# Patient Record
Sex: Female | Born: 1995 | Hispanic: No | Marital: Married | Smoking: Never smoker
Health system: Southern US, Community
[De-identification: ages and names within clinical notes are randomized; demographics above are authoritative.]

## PROBLEM LIST (undated history)

## (undated) ENCOUNTER — Inpatient Hospital Stay (HOSPITAL_COMMUNITY): Payer: Self-pay

## (undated) DIAGNOSIS — E119 Type 2 diabetes mellitus without complications: Secondary | ICD-10-CM

## (undated) DIAGNOSIS — Z789 Other specified health status: Secondary | ICD-10-CM

## (undated) HISTORY — DX: Other specified health status: Z78.9

## (undated) HISTORY — PX: NO PAST SURGERIES: SHX2092

## (undated) HISTORY — DX: Type 2 diabetes mellitus without complications: E11.9

---

## 2017-04-17 LAB — OB RESULTS CONSOLE RPR: RPR: NONREACTIVE

## 2017-04-17 LAB — OB RESULTS CONSOLE HIV ANTIBODY (ROUTINE TESTING): HIV: NONREACTIVE

## 2017-04-17 LAB — OB RESULTS CONSOLE RUBELLA ANTIBODY, IGM: Rubella: IMMUNE

## 2017-04-17 LAB — OB RESULTS CONSOLE HEPATITIS B SURFACE ANTIGEN: HEP B S AG: NEGATIVE

## 2017-06-02 LAB — OB RESULTS CONSOLE ABO/RH: RH TYPE: POSITIVE

## 2017-06-02 LAB — OB RESULTS CONSOLE ANTIBODY SCREEN: Antibody Screen: NEGATIVE

## 2017-07-03 NOTE — L&D Delivery Note (Signed)
22 y.o. G2P1001 at 4072w0d delivered a viable baby girl infant in cephalic, ROA position, with right hand compound presentation. The anterior shoulder delivered with ease. 60 sec delayed cord clamping occurred. Cord clamped x2 and cut. Placenta delivered spontaneously intact, with 3VC. Fundus firm on exam with massage and pitocin. Still with some bleeding, so 800mcg cytotec administered per rectum.   Anesthesia: None; local anesthetic (lidocaine)  Laceration: 2nd degree Suture: 3-0 Vicryl Good hemostasis noted. EBL: 300cc  Mom and baby recovering in LDR.    Apgars: APGAR (1 MIN): 8   APGAR (5 MINS): 9   APGAR (10 MINS):   Weight: Pending skin to skin    Gorden HarmsMegan Lovie Zarling, MD PGY-3 10/22/2017, 1:03 PM

## 2017-08-03 ENCOUNTER — Ambulatory Visit (INDEPENDENT_AMBULATORY_CARE_PROVIDER_SITE_OTHER): Payer: Medicaid Other | Admitting: Family Medicine

## 2017-08-03 ENCOUNTER — Encounter: Payer: Self-pay | Admitting: Family Medicine

## 2017-08-03 VITALS — BP 101/54 | HR 97 | Ht <= 58 in | Wt 116.0 lb

## 2017-08-03 DIAGNOSIS — Z603 Acculturation difficulty: Secondary | ICD-10-CM | POA: Insufficient documentation

## 2017-08-03 DIAGNOSIS — O09892 Supervision of other high risk pregnancies, second trimester: Secondary | ICD-10-CM

## 2017-08-03 DIAGNOSIS — Z789 Other specified health status: Secondary | ICD-10-CM | POA: Insufficient documentation

## 2017-08-03 DIAGNOSIS — O24419 Gestational diabetes mellitus in pregnancy, unspecified control: Secondary | ICD-10-CM

## 2017-08-03 DIAGNOSIS — O09893 Supervision of other high risk pregnancies, third trimester: Secondary | ICD-10-CM | POA: Insufficient documentation

## 2017-08-03 MED ORDER — GLUCOSE BLOOD VI STRP: ORAL_STRIP | 12 refills | Status: DC

## 2017-08-03 MED ORDER — ACCU-CHEK FASTCLIX LANCETS MISC: 1.0000 [IU] | Freq: Four times a day (QID) | 12 refills | Status: DC

## 2017-08-03 MED ORDER — ACCU-CHEK NANO SMARTVIEW W/DEVICE KIT: 1.0000 | PACK | 0 refills | Status: DC

## 2017-08-03 NOTE — Progress Notes (Signed)
Patient speak Kathleen BraunKaren. Patient is from [place]. [facility] Video interpreter used [phone] Referred from health dept for gestational diabetes 1 hr gtt: 201  Armandina StammerJennifer Howard

## 2017-08-03 NOTE — Progress Notes (Signed)
  Subjective:  Kathleen Henson is a G2P1001 8863w4d being seen today for her first obstetrical visit.  Her obstetrical history is significant for GDM. Patient seen initially at [facility]. Had abnormal GDM screening. No other problems with this pregnancy. Patient does intend to breast feed. Pregnancy history fully reviewed.  Patient reports no complaints.  BP (!) 101/54   Pulse 97   Ht 4\' 10"  (1.473 m)   Wt 116 lb (52.6 kg)   LMP 01/22/2017   BMI 24.24 kg/m   HISTORY: OB History  Gravida Para Term Preterm AB Living  2 1 1     1   SAB TAB Ectopic Multiple Live Births          1    # Outcome Date GA Lbr Len/2nd Weight Sex Delivery Anes PTL Lv  2 Current           1 Term    7 lb 1 oz (3.204 kg) F Vag-Spont   LIV      Past Medical History:  Diagnosis Date  . Medical history non-contributory     Past Surgical History:  Procedure Laterality Date  . NO PAST SURGERIES      Family History  Problem Relation Age of Onset  . Cancer Neg Hx   . Hypertension Neg Hx   . Diabetes Neg Hx      Exam   System:     Skin: normal coloration and turgor, no rashes    Neurologic: gait normal; reflexes normal and symmetric   Extremities: normal strength, tone, and muscle mass   HEENT PERRLA and extra ocular movement intact   Mouth/Teeth mucous membranes moist, pharynx normal without lesions   Neck supple and no masses   Cardiovascular: regular rate and rhythm, no murmurs or gallops   Respiratory:  appears well, vitals normal, no respiratory distress, acyanotic, normal RR, ear and throat exam is normal, neck free of mass or lymphadenopathy, chest clear, no wheezing, crepitations, rhonchi, normal symmetric air entry   Abdomen: soft, non-tender; bowel sounds normal; no masses,  no organomegaly      Assessment:    Pregnancy: G2P1001 Patient Active Problem List   Diagnosis Date Noted  . Supervision of other high risk pregnancies, third trimester 08/03/2017      Plan:   1. Supervision of other  high risk pregnancies, third trimester FHT and FH normal. Records reivewed - no laboratory or US data. Will request these.  2. Gestational diabetes mellitus (GDM) affecting pregnancy Testing supplies given. Discussed nature of GDM and need for testing. Discussed increased possiblity of shoulder dystocia, cesarean section, lacerations, brachial plexus injuries.  - Ambulatory referral to Nutrition and Diabetic Education  3. Language barrier affecting health care Interpreter used      Problem list reviewed and updated. 75% of 30 min visit spent on counseling and coordination of care.     Levie HeritageJacob J Herson Prichard 08/03/2017

## 2017-08-07 ENCOUNTER — Encounter: Payer: Self-pay | Admitting: Skilled Nursing Facility1

## 2017-08-07 ENCOUNTER — Encounter: Payer: Medicaid Other | Attending: Family Medicine | Admitting: Skilled Nursing Facility1

## 2017-08-07 DIAGNOSIS — Z713 Dietary counseling and surveillance: Secondary | ICD-10-CM | POA: Insufficient documentation

## 2017-08-07 DIAGNOSIS — O24419 Gestational diabetes mellitus in pregnancy, unspecified control: Secondary | ICD-10-CM | POA: Insufficient documentation

## 2017-08-07 DIAGNOSIS — Z3A00 Weeks of gestation of pregnancy not specified: Secondary | ICD-10-CM | POA: Insufficient documentation

## 2017-08-07 NOTE — Progress Notes (Addendum)
180006 tablet  Clydie Braun language line  2nd pregnancy  [redacted] weeks along  Stopped juice and milk  Nephew taking notes for her   Pt states her pharmacy told her her meter is 200 dollars out of pocket dietitian advised she talk with her doctor about getting it sorted out or she can do the relion at [facility] and come back here for help if she needs it.  Pt was given accucheck guide exp: 08/26/2018 lot: [id number]  pts number 77: was advised to go eat lunch. Diabetes Self-Management Education  Visit Type: First/Initial  08/07/2017  Ms. Erendida Obyrne, identified by name and date of birth, is a 22 y.o. female with a diagnosis of Diabetes: Gestational Diabetes.   ASSESSMENT  Height 4\' 10"  (1.473 m), weight 117 lb (53.1 kg), last menstrual period 01/22/2017. Body mass index is 24.45 kg/m.  Diabetes Self-Management Education - 08/07/17 1018      Visit Information   Visit Type  First/Initial      Initial Visit   Diabetes Type  Gestational Diabetes    Are you currently following a meal plan?  No    Are you taking your medications as prescribed?  Not on Medications      Health Coping   How would you rate your overall health?  Good      Psychosocial Assessment   Patient Belief/Attitude about Diabetes  Motivated to manage diabetes    Self-management support  Friends;Family    Other persons present  Interpreter;Family Member      Pre-Education Assessment   Patient understands the diabetes disease and treatment process.  Needs Instruction    Patient understands incorporating nutritional management into lifestyle.  Needs Instruction    Patient undertands incorporating physical activity into lifestyle.  Needs Instruction    Patient understands using medications safely.  Needs Instruction    Patient understands monitoring blood glucose, interpreting and using results  Needs Instruction    Patient understands prevention, detection, and treatment of acute complications.  Needs Instruction    Patient  understands prevention, detection, and treatment of chronic complications.  Needs Instruction    Patient understands how to develop strategies to address psychosocial issues.  Needs Instruction    Patient understands how to develop strategies to promote health/change behavior.  Needs Instruction      Complications   How often do you check your blood sugar?  0 times/day (not testing)      Dietary Intake   Breakfast  rice vegetables and chicken or pork belly    Lunch  rice vegetables and chicken or pork belly with fruit     Dinner  rice vegetables and chicken or pork belly with fruit     Beverage(s)  water      Exercise   Exercise Type  ADL's      Patient Education   Previous Diabetes Education  No    Nutrition management   Reviewed blood glucose goals for pre and post meals and how to evaluate the patients' food intake on their blood glucose level.;Role of diet in the treatment of diabetes and the relationship between the three main macronutrients and blood glucose level    Physical activity and exercise   Identified with patient nutritional and/or medication changes necessary with exercise.    Medications  Reviewed patients medication for diabetes, action, purpose, timing of dose and side effects.    Monitoring  Taught/evaluated SMBG meter.;Purpose and frequency of SMBG.    Acute complications  Taught treatment of  hypoglycemia - the 15 rule.;Discussed and identified patients' treatment of hyperglycemia.    Psychosocial adjustment  Worked with patient to identify barriers to care and solutions    Preconception care  Reviewed with patient blood glucose goals with pregnancy      Individualized Goals (developed by patient)   Nutrition  General guidelines for healthy choices and portions discussed    Physical Activity  Exercise 3-5 times per week;30 minutes per day    Monitoring   test my blood glucose as discussed;test blood glucose pre and post meals as discussed      Post-Education  Assessment   Patient understands the diabetes disease and treatment process.  Demonstrates understanding / competency    Patient understands incorporating nutritional management into lifestyle.  Demonstrates understanding / competency    Patient undertands incorporating physical activity into lifestyle.  Demonstrates understanding / competency    Patient understands using medications safely.  Demonstrates understanding / competency    Patient understands monitoring blood glucose, interpreting and using results  Demonstrates understanding / competency    Patient understands prevention, detection, and treatment of acute complications.  Demonstrates understanding / competency    Patient understands prevention, detection, and treatment of chronic complications.  Demonstrates understanding / competency    Patient understands how to develop strategies to address psychosocial issues.  Demonstrates understanding / competency    Patient understands how to develop strategies to promote health/change behavior.  Demonstrates understanding / competency      Outcomes   Expected Outcomes  Demonstrated interest in learning. Expect positive outcomes    Future DMSE  PRN    Program Status  Completed       Individualized Plan for Diabetes Self-Management Training:   Learning Objective:  Patient will have a greater understanding of diabetes self-management. Patient education plan is to attend individual and/or group sessions per assessed needs and concerns.   Plan:   There are no Patient Instructions on file for this visit.  Expected Outcomes:  Demonstrated interest in learning. Expect positive outcomes  Education material provided:   If problems or questions, patient to contact team via:  Phone  Future DSME appointment: PRN

## 2017-08-16 ENCOUNTER — Encounter: Payer: Medicaid Other | Admitting: Family Medicine

## 2017-08-24 ENCOUNTER — Ambulatory Visit (INDEPENDENT_AMBULATORY_CARE_PROVIDER_SITE_OTHER): Payer: Medicaid Other | Admitting: Family Medicine

## 2017-08-24 VITALS — BP 102/63 | HR 98

## 2017-08-24 DIAGNOSIS — Z789 Other specified health status: Secondary | ICD-10-CM

## 2017-08-24 DIAGNOSIS — O09893 Supervision of other high risk pregnancies, third trimester: Secondary | ICD-10-CM

## 2017-08-24 DIAGNOSIS — O24419 Gestational diabetes mellitus in pregnancy, unspecified control: Secondary | ICD-10-CM

## 2017-08-24 NOTE — Progress Notes (Signed)
Intrepreter #[id number]. Kathleen Henson

## 2017-08-24 NOTE — Progress Notes (Signed)
Subjective:  Kathleen Henson is a 22 y.o. G2P1001 at 7336w4d being seen today for ongoing prenatal care.  She is currently monitored for the following issues for this high-risk pregnancy and has Supervision of other high risk pregnancies, third trimester; Gestational diabetes mellitus (GDM) affecting pregnancy; and Language barrier affecting health care on their problem list.  GDM: Patient diet controlled.  Reports no hypoglycemic episodes. Patient did not bring log book Fasting: 70-80 2hr PP: not checking  Patient reports no complaints.   .  .  Movement: Present. Denies leaking of fluid.   The following portions of the patient's history were reviewed and updated as appropriate: allergies, current medications, past family history, past medical history, past social history, past surgical history and problem list. Problem list updated.  Objective:   Vitals:   08/24/17 0951  BP: 102/63  Pulse: 98    Fetal Status: Fetal Heart Rate (bpm): 145   Movement: Present     General:  Alert, oriented and cooperative. Patient is in no acute distress.  Skin: Skin is warm and dry. No rash noted.   Cardiovascular: Normal heart rate noted  Respiratory: Normal respiratory effort, no problems with respiration noted  Abdomen: Soft, gravid, appropriate for gestational age. Pain/Pressure: Absent     Pelvic:       Cervical exam deferred        Extremities: Normal range of motion.  Edema: None  Mental Status: Normal mood and affect. Normal behavior. Normal judgment and thought content.   Urinalysis:      Assessment and Plan:  Pregnancy: G2P1001 at 6536w4d  1. Supervision of other high risk pregnancies, third trimester FHT and FH normal  2. Language barrier affecting health care Interpreter used  3. Gestational diabetes mellitus (GDM) affecting pregnancy Discussed when to check: fasting and 2hrs after eating. Pt to return in 1 week.  Preterm labor symptoms and general obstetric precautions including but not  limited to vaginal bleeding, contractions, leaking of fluid and fetal movement were reviewed in detail with the patient. Please refer to After Visit Summary for other counseling recommendations.  No Follow-up on file.   Levie HeritageStinson, Kade Demicco J, DO

## 2017-08-31 ENCOUNTER — Ambulatory Visit (INDEPENDENT_AMBULATORY_CARE_PROVIDER_SITE_OTHER): Payer: Medicaid Other | Admitting: Family Medicine

## 2017-08-31 VITALS — BP 95/57 | HR 104 | Wt 119.0 lb

## 2017-08-31 DIAGNOSIS — Z789 Other specified health status: Secondary | ICD-10-CM

## 2017-08-31 DIAGNOSIS — O09893 Supervision of other high risk pregnancies, third trimester: Secondary | ICD-10-CM

## 2017-08-31 DIAGNOSIS — O24419 Gestational diabetes mellitus in pregnancy, unspecified control: Secondary | ICD-10-CM

## 2017-08-31 MED ORDER — METFORMIN HCL 500 MG PO TABS: 500.0000 mg | ORAL_TABLET | Freq: Every day | ORAL | 5 refills | Status: DC

## 2017-08-31 MED ORDER — ACCU-CHEK FASTCLIX LANCETS MISC: 1.0000 [IU] | Freq: Four times a day (QID) | 12 refills | Status: DC

## 2017-08-31 MED ORDER — GLUCOSE BLOOD VI STRP: ORAL_STRIP | 12 refills | Status: DC

## 2017-08-31 NOTE — Progress Notes (Signed)
Subjective:  Kathleen Henson is a 22 y.o. G2P1001 at 4621w4d being seen today for ongoing prenatal care.  She is currently monitored for the following issues for this high-risk pregnancy and has Supervision of other high risk pregnancies, third trimester; Gestational diabetes mellitus (GDM) affecting pregnancy; and Language barrier affecting health care on their problem list.  GDM: Patient diet controlled.  Reports no hypoglycemic episodes.  Fasting: controlled 2hr PP: majority elevated after breakfast and dinner to 140s  Patient reports no complaints.  Contractions: Not present. Vag. Bleeding: None.  Movement: Present. Denies leaking of fluid.   The following portions of the patient's history were reviewed and updated as appropriate: allergies, current medications, past family history, past medical history, past social history, past surgical history and problem list. Problem list updated.  Objective:   Vitals:   08/31/17 0815  BP: (!) 95/57  Pulse: (!) 104  Weight: 119 lb (54 kg)    Fetal Status: Fetal Heart Rate (bpm): 150 Fundal Height: 31 cm Movement: Present     General:  Alert, oriented and cooperative. Patient is in no acute distress.  Skin: Skin is warm and dry. No rash noted.   Cardiovascular: Normal heart rate noted  Respiratory: Normal respiratory effort, no problems with respiration noted  Abdomen: Soft, gravid, appropriate for gestational age. Pain/Pressure: Absent     Pelvic: Vag. Bleeding: None     Cervical exam deferred        Extremities: Normal range of motion.     Mental Status: Normal mood and affect. Normal behavior. Normal judgment and thought content.   Urinalysis:      Assessment and Plan:  Pregnancy: G2P1001 at 3721w4d  1. Supervision of other high risk pregnancies, third trimester FHT and FH normal  2. Gestational diabetes mellitus (GDM) affecting pregnancy Start metformin NSTs twice weekly Delivery at 39 weeks US at 38 weeks  3. Language barrier  affecting health care Interpreter used  Preterm labor symptoms and general obstetric precautions including but not limited to vaginal bleeding, contractions, leaking of fluid and fetal movement were reviewed in detail with the patient. Please refer to After Visit Summary for other counseling recommendations.  Return in about 4 days (around 09/04/2017) for OB f/u, NST.   Levie HeritageStinson, Tayna Smethurst J, DO

## 2017-09-04 ENCOUNTER — Ambulatory Visit (INDEPENDENT_AMBULATORY_CARE_PROVIDER_SITE_OTHER): Payer: Medicaid Other

## 2017-09-04 VITALS — BP 100/64 | HR 100

## 2017-09-04 DIAGNOSIS — O24419 Gestational diabetes mellitus in pregnancy, unspecified control: Secondary | ICD-10-CM

## 2017-09-04 NOTE — Progress Notes (Signed)
NST only visit. Jennifer Howard RNBSN 

## 2017-09-04 NOTE — Progress Notes (Signed)
I have reviewed the NST FHT: 140, moderate with 15x15 accels, no decels Toco: no UCs  Thressa ShellerHeather Hogan 11:27 AM 09/04/17

## 2017-09-07 ENCOUNTER — Ambulatory Visit (INDEPENDENT_AMBULATORY_CARE_PROVIDER_SITE_OTHER): Payer: Medicaid Other | Admitting: Family Medicine

## 2017-09-07 ENCOUNTER — Encounter: Payer: Medicaid Other | Admitting: Family Medicine

## 2017-09-07 VITALS — BP 108/60 | HR 110 | Wt 119.0 lb

## 2017-09-07 DIAGNOSIS — Z789 Other specified health status: Secondary | ICD-10-CM

## 2017-09-07 DIAGNOSIS — O4700 False labor before 37 completed weeks of gestation, unspecified trimester: Secondary | ICD-10-CM

## 2017-09-07 DIAGNOSIS — O24419 Gestational diabetes mellitus in pregnancy, unspecified control: Secondary | ICD-10-CM

## 2017-09-07 DIAGNOSIS — O09893 Supervision of other high risk pregnancies, third trimester: Secondary | ICD-10-CM

## 2017-09-07 DIAGNOSIS — O479 False labor, unspecified: Secondary | ICD-10-CM | POA: Diagnosis not present

## 2017-09-07 MED ORDER — NIFEDIPINE ER OSMOTIC RELEASE 30 MG PO TB24: 30.0000 mg | ORAL_TABLET | Freq: Every day | ORAL | 2 refills | Status: DC

## 2017-09-07 NOTE — Progress Notes (Signed)
   PRENATAL VISIT NOTE  Subjective:  Kathleen Henson is a 22 y.o. G2P1001 at 6969w4d being seen today for ongoing prenatal care.  She is currently monitored for the following issues for this high-risk pregnancy and has Supervision of other high risk pregnancies, third trimester; Gestational diabetes mellitus (GDM) affecting pregnancy; and Language barrier affecting health care on their problem list.  Patient reports occasional contractions.  Contractions: Not present. Vag. Bleeding: None.  Movement: Present. Denies leaking of fluid.   GDM: Patient taking metformin.  Reports no hypoglycemic episodes.  Tolerating medication well Fasting: controlled 2hr PP: controlled   The following portions of the patient's history were reviewed and updated as appropriate: allergies, current medications, past family history, past medical history, past social history, past surgical history and problem list. Problem list updated.  Objective:   Vitals:   09/07/17 0954  BP: 108/60  Pulse: (!) 110  Weight: 119 lb (54 kg)    Fetal Status: Fetal Heart Rate (bpm): NST   Movement: Present  Presentation: Vertex  General:  Alert, oriented and cooperative. Patient is in no acute distress.  Skin: Skin is warm and dry. No rash noted.   Cardiovascular: Normal heart rate noted  Respiratory: Normal respiratory effort, no problems with respiration noted  Abdomen: Soft, gravid, appropriate for gestational age.  Pain/Pressure: Absent     Pelvic: Cervical exam performed Dilation: Fingertip Effacement (%): 50 Station: -3  Extremities: Normal range of motion.     Mental Status:  Normal mood and affect. Normal behavior. Normal judgment and thought content.   Assessment and Plan:  Pregnancy: G2P1001 at 8769w4d  1. Supervision of other high risk pregnancies, third trimester FHT and FH normal  2. Gestational diabetes mellitus (GDM) affecting pregnancy controlled  3. Language barrier affecting health care Interpreter  used  4. Preterm contractions Start procardia. Patient appears comfortable through contractions.   Preterm labor symptoms and general obstetric precautions including but not limited to vaginal bleeding, contractions, leaking of fluid and fetal movement were reviewed in detail with the patient. Please refer to After Visit Summary for other counseling recommendations.  No Follow-up on file.   Levie HeritageJacob J Virginio Isidore, DO

## 2017-09-11 ENCOUNTER — Ambulatory Visit (INDEPENDENT_AMBULATORY_CARE_PROVIDER_SITE_OTHER): Payer: Medicaid Other

## 2017-09-11 VITALS — BP 106/72 | HR 102

## 2017-09-11 DIAGNOSIS — O24419 Gestational diabetes mellitus in pregnancy, unspecified control: Secondary | ICD-10-CM | POA: Diagnosis not present

## 2017-09-11 NOTE — Progress Notes (Signed)
NST reviewed Reactive NST Kathleen Henson SignsWilliams, Kathleen Henson Kathleen Henson, Kathleen Henson

## 2017-09-11 NOTE — Progress Notes (Signed)
NST only visit. Jennifer Howard RNBSN 

## 2017-09-14 ENCOUNTER — Ambulatory Visit (INDEPENDENT_AMBULATORY_CARE_PROVIDER_SITE_OTHER): Payer: Medicaid Other

## 2017-09-14 VITALS — BP 93/60 | HR 99 | Wt 119.0 lb

## 2017-09-14 DIAGNOSIS — O24419 Gestational diabetes mellitus in pregnancy, unspecified control: Secondary | ICD-10-CM

## 2017-09-14 NOTE — Progress Notes (Addendum)
Patient presents for NST only visit. Armandina StammerJennifer Howard RN  NST reviewed and reactive.  Carolyn L. Harraway-Smith, M.D., Evern CoreFACOG

## 2017-09-18 ENCOUNTER — Ambulatory Visit (INDEPENDENT_AMBULATORY_CARE_PROVIDER_SITE_OTHER): Payer: Medicaid Other

## 2017-09-18 VITALS — BP 98/62 | HR 100

## 2017-09-18 DIAGNOSIS — O24419 Gestational diabetes mellitus in pregnancy, unspecified control: Secondary | ICD-10-CM | POA: Diagnosis not present

## 2017-09-18 NOTE — Progress Notes (Signed)
NST Only visit. Vaudine Dutan RNBSN 

## 2017-09-19 NOTE — Progress Notes (Signed)
Chart reviewed for nurse visit. Agree with plan of care. NST reactive.  Rolm Bookbindereill, Dimitry Holsworth M, [place] 09/19/2017 9:09 AM

## 2017-09-21 ENCOUNTER — Ambulatory Visit (INDEPENDENT_AMBULATORY_CARE_PROVIDER_SITE_OTHER): Payer: Medicaid Other | Admitting: Family Medicine

## 2017-09-21 VITALS — BP 102/63 | HR 107 | Wt 124.0 lb

## 2017-09-21 DIAGNOSIS — Z789 Other specified health status: Secondary | ICD-10-CM

## 2017-09-21 DIAGNOSIS — O09893 Supervision of other high risk pregnancies, third trimester: Secondary | ICD-10-CM

## 2017-09-21 DIAGNOSIS — O24419 Gestational diabetes mellitus in pregnancy, unspecified control: Secondary | ICD-10-CM | POA: Diagnosis not present

## 2017-09-21 NOTE — Progress Notes (Signed)
   PRENATAL VISIT NOTE  Subjective:  Kathleen Henson is a 22 y.o. G2P1001 at 370w4d being seen today for ongoing prenatal care.  She is currently monitored for the following issues for this high-risk pregnancy and has Supervision of other high risk pregnancies, third trimester; Gestational diabetes mellitus (GDM) affecting pregnancy; and Language barrier affecting health care on their problem list.  Patient reports no complaints.  Contractions: Not present. Vag. Bleeding: None.  Movement: Present. Denies leaking of fluid.   The following portions of the patient's history were reviewed and updated as appropriate: allergies, current medications, past family history, past medical history, past social history, past surgical history and problem list. Problem list updated.  Objective:   Vitals:   09/21/17 1040  BP: 102/63  Pulse: (!) 107  Weight: 124 lb (56.2 kg)    Fetal Status: Fetal Heart Rate (bpm): NST R   Movement: Present     General:  Alert, oriented and cooperative. Patient is in no acute distress.  Skin: Skin is warm and dry. No rash noted.   Cardiovascular: Normal heart rate noted  Respiratory: Normal respiratory effort, no problems with respiration noted  Abdomen: Soft, gravid, appropriate for gestational age.  Pain/Pressure: Absent     Pelvic: Cervical exam deferred        Extremities: Normal range of motion.  Edema: None  Mental Status:  Normal mood and affect. Normal behavior. Normal judgment and thought content.   Assessment and Plan:  Pregnancy: G2P1001 at 3070w4d  1. Supervision of other high risk pregnancies, third trimester FHt and FH normal  2. Gestational diabetes mellitus (GDM) affecting pregnancy Not checking blood sugars since last visit - thought that I had said that she could stop. Has been continuing metformin. Discussed that I still want her to check CBGs. NST reactive Continue twice weekly testing.  Growth US at 37-38 weeks  3. Language barrier affecting  health care Interpreter used  Preterm labor symptoms and general obstetric precautions including but not limited to vaginal bleeding, contractions, leaking of fluid and fetal movement were reviewed in detail with the patient. Please refer to After Visit Summary for other counseling recommendations.  Return in about 1 week (around 09/28/2017) for OB f/u.   Levie HeritageJacob J Emiyah Spraggins, DO

## 2017-09-25 ENCOUNTER — Ambulatory Visit (INDEPENDENT_AMBULATORY_CARE_PROVIDER_SITE_OTHER): Payer: Medicaid Other

## 2017-09-25 VITALS — BP 99/64 | HR 102

## 2017-09-25 DIAGNOSIS — O24419 Gestational diabetes mellitus in pregnancy, unspecified control: Secondary | ICD-10-CM | POA: Diagnosis not present

## 2017-09-25 NOTE — Progress Notes (Signed)
Patient for NST only. Patient reports some decrease in movements- says movements feel smaller. Armandina StammerJennifer Caryle Helgeson RN

## 2017-09-28 ENCOUNTER — Ambulatory Visit (INDEPENDENT_AMBULATORY_CARE_PROVIDER_SITE_OTHER): Payer: Medicaid Other | Admitting: Family Medicine

## 2017-09-28 ENCOUNTER — Other Ambulatory Visit (HOSPITAL_COMMUNITY)
Admission: RE | Admit: 2017-09-28 | Discharge: 2017-09-28 | Disposition: A | Discharge status: Home / Self Care | Payer: Medicaid Other | Source: Ambulatory Visit | Attending: Family Medicine | Admitting: Family Medicine

## 2017-09-28 ENCOUNTER — Encounter: Payer: Self-pay | Admitting: Family Medicine

## 2017-09-28 VITALS — BP 97/54 | HR 90 | Wt 123.0 lb

## 2017-09-28 DIAGNOSIS — O24419 Gestational diabetes mellitus in pregnancy, unspecified control: Secondary | ICD-10-CM | POA: Diagnosis not present

## 2017-09-28 DIAGNOSIS — Z789 Other specified health status: Secondary | ICD-10-CM

## 2017-09-28 DIAGNOSIS — Z3483 Encounter for supervision of other normal pregnancy, third trimester: Secondary | ICD-10-CM | POA: Insufficient documentation

## 2017-09-28 DIAGNOSIS — Z3490 Encounter for supervision of normal pregnancy, unspecified, unspecified trimester: Secondary | ICD-10-CM

## 2017-09-28 DIAGNOSIS — O09893 Supervision of other high risk pregnancies, third trimester: Secondary | ICD-10-CM

## 2017-09-28 LAB — OB RESULTS CONSOLE GC/CHLAMYDIA: Gonorrhea: NEGATIVE

## 2017-09-28 LAB — OB RESULTS CONSOLE GBS: GBS: NEGATIVE

## 2017-09-28 NOTE — Progress Notes (Signed)
Subjective:  Kathleen Henson is a 22 y.o. G2P1001 at 3366w4d being seen today for ongoing prenatal care.  She is currently monitored for the following issues for this high-risk pregnancy and has Supervision of other high risk pregnancies, third trimester; Gestational diabetes mellitus (GDM) affecting pregnancy; and Language barrier affecting health care on their problem list.  GDM: Patient taking metformin.  Reports no hypoglycemic episodes.  Tolerating medication well Fasting: controlled 2hr PP: 3 elevated CBGs  Patient reports no complaints.  Contractions: Not present. Vag. Bleeding: None.  Movement: Present. Denies leaking of fluid.   The following portions of the patient's history were reviewed and updated as appropriate: allergies, current medications, past family history, past medical history, past social history, past surgical history and problem list. Problem list updated.  Objective:   Vitals:   09/28/17 1029  BP: (!) 97/54  Pulse: 90  Weight: 123 lb (55.8 kg)    Fetal Status: Fetal Heart Rate (bpm): NST R   Movement: Present     General:  Alert, oriented and cooperative. Patient is in no acute distress.  Skin: Skin is warm and dry. No rash noted.   Cardiovascular: Normal heart rate noted  Respiratory: Normal respiratory effort, no problems with respiration noted  Abdomen: Soft, gravid, appropriate for gestational age. Pain/Pressure: Absent     Pelvic: Vag. Bleeding: None     Cervical exam deferred        Extremities: Normal range of motion.  Edema: None  Mental Status: Normal mood and affect. Normal behavior. Normal judgment and thought content.   Urinalysis:      Assessment and Plan:  Pregnancy: G2P1001 at 8466w4d  1. Prenatal care, antepartum - GC[facility]Chlam[facility] amp ()[facility] - Culture, beta strep (group b only)  2. Supervision of other high risk pregnancies, third trimester FHT and FH normal  3. Gestational diabetes mellitus (GDM) affecting  pregnancy NST reactive. Continue Metformin Continue antenatal testing US at 38 weeks  4. Language barrier affecting health care Interpreter used  Preterm labor symptoms and general obstetric precautions including but not limited to vaginal bleeding, contractions, leaking of fluid and fetal movement were reviewed in detail with the patient. Please refer to After Visit Summary for other counseling recommendations.  No follow-ups on file.   Levie HeritageStinson, Samaira Holzworth J, DO

## 2017-10-02 ENCOUNTER — Ambulatory Visit (INDEPENDENT_AMBULATORY_CARE_PROVIDER_SITE_OTHER): Payer: Medicaid Other

## 2017-10-02 VITALS — BP 98/64 | HR 96

## 2017-10-02 DIAGNOSIS — O24419 Gestational diabetes mellitus in pregnancy, unspecified control: Secondary | ICD-10-CM | POA: Diagnosis not present

## 2017-10-02 LAB — GC/CHLAMYDIA PROBE AMP (~~LOC~~) NOT AT ARMC
Chlamydia: NEGATIVE
NEISSERIA GONORRHEA: NEGATIVE

## 2017-10-02 LAB — CULTURE, BETA STREP (GROUP B ONLY): Strep Gp B Culture: NEGATIVE

## 2017-10-03 NOTE — Progress Notes (Signed)
Patient for NST only visit. Armandina StammerJennifer Howard RN

## 2017-10-05 ENCOUNTER — Ambulatory Visit (INDEPENDENT_AMBULATORY_CARE_PROVIDER_SITE_OTHER): Payer: Medicaid Other | Admitting: Family Medicine

## 2017-10-05 ENCOUNTER — Encounter: Payer: Self-pay | Admitting: Family Medicine

## 2017-10-05 VITALS — BP 95/69 | HR 86 | Wt 126.0 lb

## 2017-10-05 DIAGNOSIS — O24419 Gestational diabetes mellitus in pregnancy, unspecified control: Secondary | ICD-10-CM

## 2017-10-05 DIAGNOSIS — O09893 Supervision of other high risk pregnancies, third trimester: Secondary | ICD-10-CM

## 2017-10-05 DIAGNOSIS — Z789 Other specified health status: Secondary | ICD-10-CM

## 2017-10-05 MED ORDER — METFORMIN HCL 500 MG PO TABS: 500.0000 mg | ORAL_TABLET | Freq: Every day | ORAL | 5 refills | Status: DC

## 2017-10-05 MED ORDER — METFORMIN HCL 500 MG PO TABS: 1000.0000 mg | ORAL_TABLET | Freq: Every day | ORAL | 5 refills | Status: DC

## 2017-10-05 NOTE — Progress Notes (Signed)
Subjective:  Kathleen Henson is a 22 y.o. G2P1001 at 1119w4d being seen today for ongoing prenatal care.  She is currently monitored for the following issues for this high-risk pregnancy and has Supervision of other high risk pregnancies, third trimester; Gestational diabetes mellitus (GDM) affecting pregnancy; and Language barrier affecting health care on their problem list.  GDM: Patient taking metformin 500mg  qAM.  Reports no hypoglycemic episodes.  Tolerating medication well Fasting: controlled 2hr PP: a few elevated CBGs  Patient reports no complaints.  Contractions: Not present. Vag. Bleeding: None.  Movement: Present. Denies leaking of fluid.   The following portions of the patient's history were reviewed and updated as appropriate: allergies, current medications, past family history, past medical history, past social history, past surgical history and problem list. Problem list updated.  Objective:   Vitals:   10/05/17 1011  BP: 95/69  Pulse: 86  Weight: 126 lb (57.2 kg)    Fetal Status: Fetal Heart Rate (bpm): NST   Movement: Present     General:  Alert, oriented and cooperative. Patient is in no acute distress.  Skin: Skin is warm and dry. No rash noted.   Cardiovascular: Normal heart rate noted  Respiratory: Normal respiratory effort, no problems with respiration noted  Abdomen: Soft, gravid, appropriate for gestational age. Pain/Pressure: Absent     Pelvic: Vag. Bleeding: None     Cervical exam deferred        Extremities: Normal range of motion.  Edema: None  Mental Status: Normal mood and affect. Normal behavior. Normal judgment and thought content.   Urinalysis:      Assessment and Plan:  Pregnancy: G2P1001 at 2319w4d  1. Supervision of other high risk pregnancies, third trimester FHT normal - US MFM OB FOLLOW UP; Future  2. Gestational diabetes mellitus (GDM) affecting pregnancy Increase metformin to 1000mg  qAM NST reactive Continue antenatal testing US at 38 wks  for growth - US MFM OB FOLLOW UP; Future  3. Language barrier affecting health care Interpreter used.  Preterm labor symptoms and general obstetric precautions including but not limited to vaginal bleeding, contractions, leaking of fluid and fetal movement were reviewed in detail with the patient. Please refer to After Visit Summary for other counseling recommendations.  No follow-ups on file.   Levie HeritageStinson, Audrea Bolte J, DO

## 2017-10-05 NOTE — Progress Notes (Signed)
Interpreter # [phone]

## 2017-10-09 ENCOUNTER — Ambulatory Visit (INDEPENDENT_AMBULATORY_CARE_PROVIDER_SITE_OTHER): Payer: Medicaid Other

## 2017-10-09 VITALS — BP 95/60 | HR 88

## 2017-10-09 DIAGNOSIS — O24419 Gestational diabetes mellitus in pregnancy, unspecified control: Secondary | ICD-10-CM

## 2017-10-09 NOTE — Progress Notes (Addendum)
   PRENATAL VISIT NOTE NST reviewed by hard copy Reactive for gestational age.ext Wynelle BourgeoisMarie Fabricio Endsley CNM

## 2017-10-09 NOTE — Progress Notes (Signed)
NST only visit. Nikeshia Keetch RN 

## 2017-10-12 ENCOUNTER — Encounter: Payer: Self-pay | Admitting: Obstetrics & Gynecology

## 2017-10-12 ENCOUNTER — Ambulatory Visit (INDEPENDENT_AMBULATORY_CARE_PROVIDER_SITE_OTHER): Payer: Medicaid Other | Admitting: Obstetrics & Gynecology

## 2017-10-12 VITALS — BP 100/70 | HR 90 | Wt 128.0 lb

## 2017-10-12 DIAGNOSIS — Z3A37 37 weeks gestation of pregnancy: Secondary | ICD-10-CM | POA: Diagnosis not present

## 2017-10-12 DIAGNOSIS — O24913 Unspecified diabetes mellitus in pregnancy, third trimester: Secondary | ICD-10-CM | POA: Diagnosis not present

## 2017-10-12 DIAGNOSIS — O09893 Supervision of other high risk pregnancies, third trimester: Secondary | ICD-10-CM | POA: Diagnosis not present

## 2017-10-12 DIAGNOSIS — Z789 Other specified health status: Secondary | ICD-10-CM

## 2017-10-12 DIAGNOSIS — O24419 Gestational diabetes mellitus in pregnancy, unspecified control: Secondary | ICD-10-CM

## 2017-10-12 NOTE — Progress Notes (Signed)
   PRENATAL VISIT NOTE  Subjective:  Kathleen Henson is a 22 y.o. G2P1001 at 5062w4d being seen today for ongoing prenatal care.  She is currently monitored for the following issues for this high-risk pregnancy and has Supervision of other high risk pregnancies, third trimester; Gestational diabetes mellitus (GDM) affecting pregnancy; and Language barrier affecting health care on their problem list.  Patient reports no complaints.  Contractions: Not present. Vag. Bleeding: None.  Movement: Present. Denies leaking of fluid.   The following portions of the patient's history were reviewed and updated as appropriate: allergies, current medications, past family history, past medical history, past social history, past surgical history and problem list. Problem list updated.  Objective:   Vitals:   10/12/17 1007  BP: 100/70  Pulse: 90  Weight: 128 lb (58.1 kg)    Fetal Status: Fetal Heart Rate (bpm): NST   Movement: Present     General:  Alert, oriented and cooperative. Patient is in no acute distress.  Skin: Skin is warm and dry. No rash noted.   Cardiovascular: Normal heart rate noted  Respiratory: Normal respiratory effort, no problems with respiration noted  Abdomen: Soft, gravid, appropriate for gestational age.  Pain/Pressure: Absent     Pelvic: Cervical exam deferred        Extremities: Normal range of motion.  Edema: None  Mental Status: Normal mood and affect. Normal behavior. Normal judgment and thought content.   Assessment and Plan:  Pregnancy: G2P1001 at 5662w4d  1. Supervision of other high risk pregnancies, third trimester  2. Language barrier affecting health care Video translator used for the entire visit.   3. Gestational diabetes mellitus (GDM) affecting pregnancy All glc values are WNL Pt scheduled for IOL at 39 weeks has a f/u US next week for growth 10/22/2017 Keep Metformin 1000mg  daily  NST reviewed and reactive.  Term labor symptoms and general obstetric  precautions including but not limited to vaginal bleeding, contractions, leaking of fluid and fetal movement were reviewed in detail with the patient. Please refer to After Visit Summary for other counseling recommendations.  Return in about 1 week (around 10/19/2017).  Future Appointments  Date Time Provider Department Center  10/16/2017 10:00 AM [facility] NURSE [facility] None  10/18/2017 10:15 AM Willodean RosenthalHarraway-Smith, Jahmier Willadsen, MD [facility] None  10/19/2017  9:30 AM [facility] US 5 [facility]-US  10/23/2017  9:30 AM [facility] NURSE [facility] None  10/26/2017 10:15 AM Stinson, Rhona RaiderJacob J, DO [facility] None    Willodean Rosenthalarolyn Harraway-Smith, MD

## 2017-10-12 NOTE — Progress Notes (Signed)
[employer] video interpreter [phone] used for visit. Armandina StammerJennifer Jameer Storie RN

## 2017-10-13 ENCOUNTER — Inpatient Hospital Stay (HOSPITAL_COMMUNITY)
Admission: AD | Admit: 2017-10-13 | Discharge: 2017-10-13 | Disposition: A | Discharge status: Home / Self Care | Payer: Medicaid Other | Source: Ambulatory Visit | Attending: Obstetrics and Gynecology | Admitting: Obstetrics and Gynecology

## 2017-10-13 ENCOUNTER — Encounter (HOSPITAL_COMMUNITY): Payer: Self-pay

## 2017-10-13 DIAGNOSIS — Z3A39 39 weeks gestation of pregnancy: Secondary | ICD-10-CM | POA: Diagnosis not present

## 2017-10-13 DIAGNOSIS — Z3483 Encounter for supervision of other normal pregnancy, third trimester: Secondary | ICD-10-CM | POA: Diagnosis not present

## 2017-10-13 DIAGNOSIS — O479 False labor, unspecified: Secondary | ICD-10-CM

## 2017-10-13 NOTE — Discharge Instructions (Signed)
Braxton Hicks Contractions °Contractions of the uterus can occur throughout pregnancy, but they are not always a sign that you are in labor. You may have practice contractions called Braxton Hicks contractions. These false labor contractions are sometimes confused with true labor. °What are Braxton Hicks contractions? °Braxton Hicks contractions are tightening movements that occur in the muscles of the uterus before labor. Unlike true labor contractions, these contractions do not result in opening (dilation) and thinning of the cervix. Toward the end of pregnancy (32-34 weeks), Braxton Hicks contractions can happen more often and may become stronger. These contractions are sometimes difficult to tell apart from true labor because they can be very uncomfortable. You should not feel embarrassed if you go to the hospital with false labor. °Sometimes, the only way to tell if you are in true labor is for your health care provider to look for changes in the cervix. The health care provider will do a physical exam and may monitor your contractions. If you are not in true labor, the exam should show that your cervix is not dilating and your water has not broken. °If there are other health problems associated with your pregnancy, it is completely safe for you to be sent home with false labor. You may continue to have Braxton Hicks contractions until you go into true labor. °How to tell the difference between true labor and false labor °True labor °· Contractions last 30-70 seconds. °· Contractions become very regular. °· Discomfort is usually felt in the top of the uterus, and it spreads to the lower abdomen and low back. °· Contractions do not go away with walking. °· Contractions usually become more intense and increase in frequency. °· The cervix dilates and gets thinner. °False labor °· Contractions are usually shorter and not as strong as true labor contractions. °· Contractions are usually irregular. °· Contractions  are often felt in the front of the lower abdomen and in the groin. °· Contractions may go away when you walk around or change positions while lying down. °· Contractions get weaker and are shorter-lasting as time goes on. °· The cervix usually does not dilate or become thin. °Follow these instructions at home: °· Take over-the-counter and prescription medicines only as told by your health care provider. °· Keep up with your usual exercises and follow other instructions from your health care provider. °· Eat and drink lightly if you think you are going into labor. °· If Braxton Hicks contractions are making you uncomfortable: °? Change your position from lying down or resting to walking, or change from walking to resting. °? Sit and rest in a tub of warm water. °? Drink enough fluid to keep your urine pale yellow. Dehydration may cause these contractions. °? Do slow and deep breathing several times an hour. °· Keep all follow-up prenatal visits as told by your health care provider. This is important. °Contact a health care provider if: °· You have a fever. °· You have continuous pain in your abdomen. °Get help right away if: °· Your contractions become stronger, more regular, and closer together. °· You have fluid leaking or gushing from your vagina. °· You pass blood-tinged mucus (bloody show). °· You have bleeding from your vagina. °· You have low back pain that you never had before. °· You feel your baby’s head pushing down and causing pelvic pressure. °· Your baby is not moving inside you as much as it used to. °Summary °· Contractions that occur before labor are called Braxton   Hicks contractions, false labor, or practice contractions. °· Braxton Hicks contractions are usually shorter, weaker, farther apart, and less regular than true labor contractions. True labor contractions usually become progressively stronger and regular and they become more frequent. °· Manage discomfort from Braxton Hicks contractions by  changing position, resting in a warm bath, drinking plenty of water, or practicing deep breathing. °This information is not intended to replace advice given to you by your health care provider. Make sure you discuss any questions you have with your health care provider. °Document Released: 11/02/2016 Document Revised: 11/02/2016 Document Reviewed: 11/02/2016 °Elsevier Interactive Patient Education © 2018 [employer] ° °

## 2017-10-13 NOTE — MAU Note (Signed)
I have communicated with Dr. Abelardo DieselMcMullen and reviewed vital signs:  Vitals:   10/13/17 0523 10/13/17 0708  BP: 104/69 (!) 99/59  Pulse: 99 89  Resp: 16 16  Temp: 98.8 F (37.1 C)   SpO2: 97%     Vaginal exam:  Dilation: 2 Effacement (%): 70 Station: -2 Presentation: Vertex Exam by:: Ciscomber Stovall RN,   Also reviewed contraction pattern and that non-stress test is reactive.  It has been documented that patient is contracting every 3-7 minutes with no cervical change over 1 hours not indicating active labor.  Patient denies any other complaints.  Based on this report provider has given order for discharge.  A discharge order and diagnosis entered by a provider.   Labor discharge instructions reviewed with patient.

## 2017-10-13 NOTE — MAU Note (Signed)
Pt reporting contractions that started at 2:45a and are every 3-4 minutes. Pt denies LOF or vaginal bleeding. Pt states cervix has not been examined in the office.

## 2017-10-15 ENCOUNTER — Telehealth (HOSPITAL_COMMUNITY): Payer: Self-pay | Admitting: *Deleted

## 2017-10-15 NOTE — Telephone Encounter (Signed)
Preadmission screen Interpreter number [phone]

## 2017-10-16 ENCOUNTER — Ambulatory Visit (INDEPENDENT_AMBULATORY_CARE_PROVIDER_SITE_OTHER): Payer: Medicaid Other

## 2017-10-16 VITALS — BP 106/66 | HR 89

## 2017-10-16 DIAGNOSIS — O24419 Gestational diabetes mellitus in pregnancy, unspecified control: Secondary | ICD-10-CM | POA: Diagnosis not present

## 2017-10-16 NOTE — Progress Notes (Signed)
NST reviewed - FHT baseline 135 with moderate variability and 15x15 accels noted - irregular mild contractions noted - client did not feel them.  No decelerations noted - reactive strip.  Nolene BernheimERRI BURLESON, RN, MSN, NP-BC Nurse Practitioner, [facility], [facility] 10/16/2017 12:03 PM

## 2017-10-16 NOTE — Progress Notes (Signed)
NST only visit. Jennifer Howard RN 

## 2017-10-18 ENCOUNTER — Encounter: Payer: Self-pay | Admitting: Obstetrics & Gynecology

## 2017-10-18 ENCOUNTER — Other Ambulatory Visit: Payer: Self-pay | Admitting: Obstetrics & Gynecology

## 2017-10-18 ENCOUNTER — Ambulatory Visit (INDEPENDENT_AMBULATORY_CARE_PROVIDER_SITE_OTHER): Payer: Medicaid Other | Admitting: Obstetrics & Gynecology

## 2017-10-18 VITALS — BP 102/60 | HR 98 | Wt 129.0 lb

## 2017-10-18 DIAGNOSIS — O24419 Gestational diabetes mellitus in pregnancy, unspecified control: Secondary | ICD-10-CM | POA: Diagnosis not present

## 2017-10-18 DIAGNOSIS — O09893 Supervision of other high risk pregnancies, third trimester: Secondary | ICD-10-CM

## 2017-10-18 DIAGNOSIS — Z789 Other specified health status: Secondary | ICD-10-CM

## 2017-10-18 NOTE — Progress Notes (Signed)
   PRENATAL VISIT NOTE  Subjective:  Mana Bohannon is a 22 y.o. G2P1001 at 6666w3d being seen today for ongoing prenatal care.  She is currently monitored for the following issues for this high-risk pregnancy and has Supervision of other high risk pregnancies, third trimester; Gestational diabetes mellitus (GDM) affecting pregnancy; and Language barrier affecting health care on their problem list.  Patient reports no complaints.  Contractions: Not present. Vag. Bleeding: None.  Movement: Present. Denies leaking of fluid.   The following portions of the patient's history were reviewed and updated as appropriate: allergies, current medications, past family history, past medical history, past social history, past surgical history and problem list. Problem list updated.  Objective:   Vitals:   10/18/17 1003  BP: 102/60  Pulse: 98  Weight: 129 lb (58.5 kg)    Fetal Status: Fetal Heart Rate (bpm): NST   Movement: Present     General:  Alert, oriented and cooperative. Patient is in no acute distress.  Skin: Skin is warm and dry. No rash noted.   Cardiovascular: Normal heart rate noted  Respiratory: Normal respiratory effort, no problems with respiration noted  Abdomen: Soft, gravid, appropriate for gestational age.  Pain/Pressure: Absent     Pelvic: Cervical exam deferred        Extremities: Normal range of motion.  Edema: None  Mental Status: Normal mood and affect. Normal behavior. Normal judgment and thought content.   Assessment and Plan:  Pregnancy: G2P1001 at 4866w3d  1. Supervision of other high risk pregnancies, third trimester  2. Language barrier affecting health care [name] interpreter used for entire visit.   3. Gestational diabetes mellitus (GDM) affecting pregnancy NST reviewed and reactive. All glc values within normal limits Scheduled for IOL in 5 days  Preterm labor symptoms and general obstetric precautions including but not limited to vaginal bleeding, contractions,  leaking of fluid and fetal movement were reviewed in detail with the patient. Please refer to After Visit Summary for other counseling recommendations.  Return in about 5 weeks (around 11/22/2017).  Future Appointments  Date Time Provider Department Center  10/19/2017  9:30 AM [facility] US 5 [facility]-US  10/22/2017  7:30 AM [facility] ROOM [facility] None    Willodean Rosenthalarolyn Harraway-Smith, MD

## 2017-10-19 ENCOUNTER — Ambulatory Visit (HOSPITAL_COMMUNITY)
Admission: RE | Admit: 2017-10-19 | Discharge: 2017-10-19 | Disposition: A | Discharge status: Home / Self Care | Payer: Medicaid Other | Source: Ambulatory Visit | Attending: Family Medicine | Admitting: Family Medicine

## 2017-10-19 ENCOUNTER — Other Ambulatory Visit: Payer: Self-pay | Admitting: Family Medicine

## 2017-10-19 DIAGNOSIS — O09893 Supervision of other high risk pregnancies, third trimester: Secondary | ICD-10-CM

## 2017-10-19 DIAGNOSIS — Z3A38 38 weeks gestation of pregnancy: Secondary | ICD-10-CM | POA: Insufficient documentation

## 2017-10-19 DIAGNOSIS — Z363 Encounter for antenatal screening for malformations: Secondary | ICD-10-CM

## 2017-10-19 DIAGNOSIS — O24419 Gestational diabetes mellitus in pregnancy, unspecified control: Secondary | ICD-10-CM

## 2017-10-19 DIAGNOSIS — O24415 Gestational diabetes mellitus in pregnancy, controlled by oral hypoglycemic drugs: Secondary | ICD-10-CM | POA: Diagnosis not present

## 2017-10-22 ENCOUNTER — Inpatient Hospital Stay (HOSPITAL_COMMUNITY)
Admission: RE | Admit: 2017-10-22 | Discharge: 2017-10-23 | DRG: 807 | Disposition: A | Discharge status: Home / Self Care | Payer: Medicaid Other | Source: Ambulatory Visit | Attending: Obstetrics and Gynecology | Admitting: Obstetrics and Gynecology

## 2017-10-22 ENCOUNTER — Other Ambulatory Visit: Payer: Self-pay

## 2017-10-22 ENCOUNTER — Encounter (HOSPITAL_COMMUNITY): Payer: Self-pay

## 2017-10-22 DIAGNOSIS — Z3A39 39 weeks gestation of pregnancy: Secondary | ICD-10-CM | POA: Diagnosis not present

## 2017-10-22 DIAGNOSIS — O24425 Gestational diabetes mellitus in childbirth, controlled by oral hypoglycemic drugs: Secondary | ICD-10-CM | POA: Diagnosis present

## 2017-10-22 DIAGNOSIS — O24415 Gestational diabetes mellitus in pregnancy, controlled by oral hypoglycemic drugs: Secondary | ICD-10-CM | POA: Diagnosis present

## 2017-10-22 DIAGNOSIS — O24429 Gestational diabetes mellitus in childbirth, unspecified control: Secondary | ICD-10-CM | POA: Diagnosis not present

## 2017-10-22 DIAGNOSIS — O326XX0 Maternal care for compound presentation, not applicable or unspecified: Secondary | ICD-10-CM | POA: Diagnosis present

## 2017-10-22 LAB — CBC
HEMATOCRIT: 35.8 % — AB (ref 36.0–46.0)
Hemoglobin: 12.1 g/dL (ref 12.0–15.0)
MCH: 27.2 pg (ref 26.0–34.0)
MCHC: 33.8 g/dL (ref 30.0–36.0)
MCV: 80.4 fL (ref 78.0–100.0)
Platelets: 293 10*3/uL (ref 150–400)
RBC: 4.45 MIL/uL (ref 3.87–5.11)
RDW: 13 % (ref 11.5–15.5)
WBC: 10 10*3/uL (ref 4.0–10.5)

## 2017-10-22 LAB — GLUCOSE, CAPILLARY
GLUCOSE-CAPILLARY: 83 mg/dL (ref 65–99)
Glucose-Capillary: 68 mg/dL (ref 65–99)

## 2017-10-22 LAB — TYPE AND SCREEN
ABO/RH(D): O POS
Antibody Screen: NEGATIVE

## 2017-10-22 LAB — ABO/RH: ABO/RH(D): O POS

## 2017-10-22 LAB — RPR: RPR: NONREACTIVE

## 2017-10-22 MED ORDER — WITCH HAZEL-GLYCERIN EX PADS: 1.0000 "application " | MEDICATED_PAD | CUTANEOUS | Status: DC | PRN

## 2017-10-22 MED ORDER — OXYCODONE HCL 5 MG PO TABS: 10.0000 mg | ORAL_TABLET | ORAL | Status: DC | PRN

## 2017-10-22 MED ORDER — COCONUT OIL OIL: 1.0000 "application " | TOPICAL_OIL | Status: DC | PRN

## 2017-10-22 MED ORDER — BENZOCAINE-MENTHOL 20-0.5 % EX AERO: 1.0000 "application " | INHALATION_SPRAY | CUTANEOUS | Status: DC | PRN

## 2017-10-22 MED ORDER — LACTATED RINGERS IV SOLN
INTRAVENOUS | Status: DC
  Administered 2017-10-22: 08:00:00 via INTRAVENOUS

## 2017-10-22 MED ORDER — OXYTOCIN BOLUS FROM INFUSION
500.0000 mL | Freq: Once | INTRAVENOUS | Status: AC
  Administered 2017-10-22: 500 mL via INTRAVENOUS

## 2017-10-22 MED ORDER — MISOPROSTOL 200 MCG PO TABS
800.0000 ug | ORAL_TABLET | Freq: Once | ORAL | Status: AC
  Administered 2017-10-22: 800 ug via RECTAL

## 2017-10-22 MED ORDER — DIBUCAINE 1 % RE OINT: 1.0000 "application " | TOPICAL_OINTMENT | RECTAL | Status: DC | PRN

## 2017-10-22 MED ORDER — FENTANYL CITRATE (PF) 100 MCG/2ML IJ SOLN
100.0000 ug | INTRAMUSCULAR | Status: DC | PRN
  Administered 2017-10-22: 100 ug via INTRAVENOUS
  Filled 2017-10-22: qty 2

## 2017-10-22 MED ORDER — SENNOSIDES-DOCUSATE SODIUM 8.6-50 MG PO TABS
2.0000 | ORAL_TABLET | ORAL | Status: DC
  Administered 2017-10-22: 2 via ORAL
  Filled 2017-10-22: qty 2

## 2017-10-22 MED ORDER — LACTATED RINGERS IV SOLN: 500.0000 mL | INTRAVENOUS | Status: DC | PRN

## 2017-10-22 MED ORDER — ZOLPIDEM TARTRATE 5 MG PO TABS: 5.0000 mg | ORAL_TABLET | Freq: Every evening | ORAL | Status: DC | PRN

## 2017-10-22 MED ORDER — DIPHENHYDRAMINE HCL 25 MG PO CAPS: 25.0000 mg | ORAL_CAPSULE | Freq: Four times a day (QID) | ORAL | Status: DC | PRN

## 2017-10-22 MED ORDER — OXYCODONE-ACETAMINOPHEN 5-325 MG PO TABS: 1.0000 | ORAL_TABLET | ORAL | Status: DC | PRN

## 2017-10-22 MED ORDER — ONDANSETRON HCL 4 MG/2ML IJ SOLN: 4.0000 mg | Freq: Four times a day (QID) | INTRAMUSCULAR | Status: DC | PRN

## 2017-10-22 MED ORDER — SOD CITRATE-CITRIC ACID 500-334 MG/5ML PO SOLN: 30.0000 mL | ORAL | Status: DC | PRN

## 2017-10-22 MED ORDER — OXYTOCIN 40 UNITS IN LACTATED RINGERS INFUSION - SIMPLE MED
1.0000 m[IU]/min | INTRAVENOUS | Status: DC
  Administered 2017-10-22: 2 m[IU]/min via INTRAVENOUS

## 2017-10-22 MED ORDER — ACETAMINOPHEN 325 MG PO TABS: 650.0000 mg | ORAL_TABLET | ORAL | Status: DC | PRN

## 2017-10-22 MED ORDER — ONDANSETRON HCL 4 MG PO TABS: 4.0000 mg | ORAL_TABLET | ORAL | Status: DC | PRN

## 2017-10-22 MED ORDER — IBUPROFEN 600 MG PO TABS
600.0000 mg | ORAL_TABLET | Freq: Four times a day (QID) | ORAL | Status: DC
  Administered 2017-10-22 – 2017-10-23 (×4): 600 mg via ORAL
  Filled 2017-10-22 (×4): qty 1

## 2017-10-22 MED ORDER — OXYCODONE HCL 5 MG PO TABS
5.0000 mg | ORAL_TABLET | ORAL | Status: DC | PRN
  Administered 2017-10-22 – 2017-10-23 (×3): 5 mg via ORAL
  Filled 2017-10-22 (×3): qty 1

## 2017-10-22 MED ORDER — ONDANSETRON HCL 4 MG/2ML IJ SOLN: 4.0000 mg | INTRAMUSCULAR | Status: DC | PRN

## 2017-10-22 MED ORDER — SIMETHICONE 80 MG PO CHEW: 80.0000 mg | CHEWABLE_TABLET | ORAL | Status: DC | PRN

## 2017-10-22 MED ORDER — TERBUTALINE SULFATE 1 MG/ML IJ SOLN
0.2500 mg | Freq: Once | INTRAMUSCULAR | Status: DC | PRN
  Filled 2017-10-22: qty 1

## 2017-10-22 MED ORDER — OXYTOCIN 40 UNITS IN LACTATED RINGERS INFUSION - SIMPLE MED
2.5000 [IU]/h | INTRAVENOUS | Status: DC
  Filled 2017-10-22: qty 1000

## 2017-10-22 MED ORDER — MISOPROSTOL 25 MCG QUARTER TABLET
25.0000 ug | ORAL_TABLET | ORAL | Status: DC | PRN
  Filled 2017-10-22: qty 1

## 2017-10-22 MED ORDER — PRENATAL MULTIVITAMIN CH
1.0000 | ORAL_TABLET | Freq: Every day | ORAL | Status: DC
  Administered 2017-10-22 – 2017-10-23 (×2): 1 via ORAL
  Filled 2017-10-22 (×2): qty 1

## 2017-10-22 MED ORDER — MISOPROSTOL 200 MCG PO TABS
ORAL_TABLET | ORAL | Status: AC
  Filled 2017-10-22: qty 4

## 2017-10-22 MED ORDER — LIDOCAINE HCL (PF) 1 % IJ SOLN
30.0000 mL | INTRAMUSCULAR | Status: DC | PRN
  Administered 2017-10-22: 30 mL via SUBCUTANEOUS
  Filled 2017-10-22: qty 30

## 2017-10-22 MED ORDER — TETANUS-DIPHTH-ACELL PERTUSSIS 5-2.5-18.5 LF-MCG/0.5 IM SUSP: 0.5000 mL | Freq: Once | INTRAMUSCULAR | Status: DC

## 2017-10-22 MED ORDER — OXYCODONE-ACETAMINOPHEN 5-325 MG PO TABS
2.0000 | ORAL_TABLET | ORAL | Status: DC | PRN
  Administered 2017-10-22: 2 via ORAL
  Filled 2017-10-22: qty 2

## 2017-10-22 NOTE — Anesthesia Pain Management Evaluation Note (Signed)
  CRNA Pain Management Visit Note  Patient: Kathleen Henson, 22 y.o., female  "Hello [facility] We have an anesthesia team available at all times to provide care throughout the hospital, including epidural management and anesthesia for C-section. I don't know your plan for the delivery whether it a natural birth, water birth, IV sedation, nitrous supplementation, doula or epidural, but we want to meet your pain goals."   1.Was your pain managed to your expectations on prior hospitalizations?   Yes   2.What is your expectation for pain management during this hospitalization?     IV pain meds  3.How can we help you reach that goal? IV pain meds when ready.  Record the patient's initial score and the patient's pain goal.   Pain: 8, but reports to be OK now. Interprepter at bedside.  Pain Goal: 8 [facility] wants you to be able to say your pain was always managed very well.  [name] 10/22/2017

## 2017-10-22 NOTE — Progress Notes (Signed)
S: Patient seen & examined for progress of labor. Patient starting to feel more uncomfortable, with more intense contractions and lower pelvic pressure. No epidural desired by patient.     O:  Vitals:   10/22/17 1000 10/22/17 1030 10/22/17 1100 10/22/17 1120  BP: 103/62 (!) 100/57 100/65   Pulse: 90 70 91   Resp: 18 18 16    Temp:    98.4 F (36.9 C)  TempSrc:    Oral  Weight:      Height:        Dilation: 4 Effacement (%): 70 Station: -2 Presentation: Vertex Exam by:: Dr. Orvan Falconerampbell   FHT: 135bpm, mod var, +accels, no decels TOCO: q 2min   A/P: This is a 22 y.o. female G2P1001 with IUP at 5460w0d by LMP presenting for IOL secondary to A2 GDM. EFW 6lbs 8oz (35%) on 10/19/17 ultrasound.  AROM at 11:20 without complications. Moderate amount of clear fluid.  FHT remains category I Currently on pitocin.  Continue expectant management, otherwise Anticipate SVD   Gorden HarmsMegan Hidaya Daniel, MD PGY-2 10/22/2017 11:24 AM

## 2017-10-22 NOTE — H&P (Signed)
LABOR AND DELIVERY ADMISSION HISTORY AND PHYSICAL NOTE  Kathleen Henson is a 22 y.o. female G2P1001 with IUP at 5536w0d by LMP presenting for IOL secondary to A2 GDM. EFW 6lbs 8oz (35%) on 10/19/17 ultrasound.  She thinks she was taking a pill for her diabetes, but is uncertain. She has not taken over the past 3 days. Her initial blood glucose is 68. Fasting blood sugars at home have been around 80. Post prandial blood glucose readings have been in the low 100s.   She reports positive fetal movement. She denies leakage of fluid or vaginal bleeding. She also reports feeling regular uterine contractions.   Prenatal History/Complications:  Past Medical History: Past Medical History:  Diagnosis Date  . Diabetes mellitus without complication (HCC)   . Medical history non-contributory     Past Surgical History: Past Surgical History:  Procedure Laterality Date  . NO PAST SURGERIES      Obstetrical History: OB History    Gravida  2   Para  1   Term  1   Preterm      AB      Living  1     SAB      TAB      Ectopic      Multiple      Live Births  1           Social History: Social History   Socioeconomic History  . Marital status: Married    Spouse name: Not on file  . Number of children: Not on file  . Years of education: Not on file  . Highest education level: Not on file  Occupational History  . Not on file  Social Needs  . Financial resource strain: Not on file  . Food insecurity:    Worry: Not on file    Inability: Not on file  . Transportation needs:    Medical: Not on file    Non-medical: Not on file  Tobacco Use  . Smoking status: Never Smoker  . Smokeless tobacco: Never Used  Substance and Sexual Activity  . Alcohol use: No    Frequency: Never  . Drug use: No  . Sexual activity: Yes  Lifestyle  . Physical activity:    Days per week: Not on file    Minutes per session: Not on file  . Stress: Not on file  Relationships  . Social connections:     Talks on phone: Not on file    Gets together: Not on file    Attends religious service: Not on file    Active member of club or organization: Not on file    Attends meetings of clubs or organizations: Not on file    Relationship status: Not on file  Other Topics Concern  . Not on file  Social History Narrative  . Not on file    Family History: Family History  Problem Relation Age of Onset  . Cancer Neg Hx   . Hypertension Neg Hx   . Diabetes Neg Hx     Allergies: No Known Allergies  No medications prior to admission.     Review of Systems   All systems reviewed and negative except as stated in HPI  Blood pressure (!) 92/54, pulse 84, temperature 98.1 F (36.7 C), temperature source Oral, resp. rate 16, height 4\' 10"  (1.473 m), weight 124 lb 14.4 oz (56.7 kg), last menstrual period 01/22/2017. General appearance: alert, cooperative and no distress Lungs: clear to auscultation bilaterally  Heart: regular rate and rhythm Abdomen: soft, non-tender; bowel sounds normal Extremities: No calf swelling or tenderness Presentation: cephalic Fetal monitoring: baseline 145, +accels, -decels, mod variability Uterine activity: contractions q2-13min Dilation: 3 Effacement (%): 60 Station: -2 Exam by:: Leanna Sato RN    Prenatal labs: ABO, Rh: O/Positive/-- (12/01 0000) Antibody: Negative (12/01 0000) Rubella: Immune (10/16 0000) RPR: Nonreactive (10/16 0000)  HBsAg: Negative (10/16 0000)  HIV: Non-reactive (10/16 0000)  GBS: Negative (03/29 0000)  1 hr Glucola: unavailable Genetic screening:  unavailable Anatomy US: normal 06/21/17. Baby girl   Prenatal Transfer Tool  Maternal Diabetes: Yes:  Diabetes Type:  Insulin/Medication controlled Genetic Screening: Declined Maternal Ultrasounds/Referrals: Normal Fetal Ultrasounds or other Referrals:  None Maternal Substance Abuse:  No Significant Maternal Medications:  Meds include: Other: Diabetes medication, suspect  metformin Significant Maternal Lab Results: None  Results for orders placed or performed during the hospital encounter of 10/22/17 (from the past 24 hour(s))  CBC   Collection Time: 10/22/17  7:42 AM  Result Value Ref Range   WBC 10.0 4.0 - 10.5 K/uL   RBC 4.45 3.87 - 5.11 MIL/uL   Hemoglobin 12.1 12.0 - 15.0 g/dL   HCT 16.1 (L) 09.6 - 04.5 %   MCV 80.4 78.0 - 100.0 fL   MCH 27.2 26.0 - 34.0 pg   MCHC 33.8 30.0 - 36.0 g/dL   RDW 40.9 81.1 - 91.4 %   Platelets 293 150 - 400 K/uL  Glucose, capillary   Collection Time: 10/22/17  8:17 AM  Result Value Ref Range   Glucose-Capillary 68 65 - 99 mg/dL   Comment 1 Notify RN    Comment 2 Document in Chart     Patient Active Problem List   Diagnosis Date Noted  . Gestational diabetes mellitus (GDM) controlled on oral hypoglycemic drug 10/22/2017  . [redacted] weeks gestation of pregnancy   . Encounter for antenatal screening for malformations   . Supervision of other high risk pregnancies, third trimester 08/03/2017  . Gestational diabetes mellitus (GDM) affecting pregnancy 08/03/2017  . Language barrier affecting health care 08/03/2017    Assessment: Kathleen Henson is a 22 y.o. G2P1001 at [redacted]w[redacted]d here for IOL secondary to A2 GDM that appears to be well-controlled.   #Labor: induction of labor with pitocin. Cervical exam initially 3/60/-2 on arrival.  #A2GDM: q4h glucose checks, may increase to q2hrs once active labor if necessary #Pain: No epidural, stadol PRN #FWB: Cat I. Baseline 145, +accels, - decels, mod variability.  #ID:  GBS negative #MOF: Breast and bottle #MOC: Undecided.  #Circ:  N/A. Baby girl.   Lise Auer, MD PGY-3 10/22/2017, 9:28 AM

## 2017-10-22 NOTE — Progress Notes (Addendum)
Translator line utilized. Pain management plan and faces pain scale discussed with patient. Discussed signs/symptoms to report to nurse. Ordered patient a snack for the night. Plan for night discussed.  Offered to set pt up with a pump to supplement with BM, as they have been using formula. Denied wanting to use a breast pump and wants to continue supplementing breast feedings with formula. Verified they understood formula supplementation amounts and encouraged to put baby to breast first before bottle.

## 2017-10-22 NOTE — Progress Notes (Signed)
Admission, plan of care for the day, breastfeeding/bottle feeding explained and taught for the day with the use of a [place] interpretor. Depression scale completed with the use of an interpretor and which medications are scheduled for the patient the rest of the day and the night.

## 2017-10-23 ENCOUNTER — Other Ambulatory Visit: Payer: Medicaid Other

## 2017-10-23 ENCOUNTER — Encounter (HOSPITAL_COMMUNITY): Payer: Self-pay

## 2017-10-23 MED ORDER — IBUPROFEN 600 MG PO TABS: 600.0000 mg | ORAL_TABLET | Freq: Four times a day (QID) | ORAL | 0 refills | Status: DC | PRN

## 2017-10-23 MED ORDER — PRENATAL MULTIVITAMIN CH: 1.0000 | ORAL_TABLET | Freq: Every day | ORAL | 0 refills | Status: DC

## 2017-10-23 MED ORDER — IBUPROFEN 600 MG PO TABS: 600.0000 mg | ORAL_TABLET | Freq: Four times a day (QID) | ORAL | 0 refills | Status: DC

## 2017-10-23 NOTE — Discharge Instructions (Signed)
Postpartum Care After Vaginal Delivery °The period of time right after you deliver your newborn is called the postpartum period. °What kind of medical care will I receive? °· You may continue to receive fluids and medicines through an IV tube inserted into one of your veins. °· If an incision was made near your vagina (episiotomy) or if you had some vaginal tearing during delivery, cold compresses may be placed on your episiotomy or your tear. This helps to reduce pain and swelling. °· You may be given a squirt bottle to use when you go to the bathroom. You may use this until you are comfortable wiping as usual. To use the squirt bottle, follow these steps: °? Before you urinate, fill the squirt bottle with warm water. Do not use hot water. °? After you urinate, while you are sitting on the toilet, use the squirt bottle to rinse the area around your urethra and vaginal opening. This rinses away any urine and blood. °? You may do this instead of wiping. As you start healing, you may use the squirt bottle before wiping yourself. Make sure to wipe gently. °? Fill the squirt bottle with clean water every time you use the bathroom. °· You will be given sanitary pads to wear. °How can I expect to feel? °· You may not feel the need to urinate for several hours after delivery. °· You will have some soreness and pain in your abdomen and vagina. °· If you are breastfeeding, you may have uterine contractions every time you breastfeed for up to several weeks postpartum. Uterine contractions help your uterus return to its normal size. °· It is normal to have vaginal bleeding (lochia) after delivery. The amount and appearance of lochia is often similar to a menstrual period in the first week after delivery. It will gradually decrease over the next few weeks to a dry, yellow-brown discharge. For most women, lochia stops completely by 6-8 weeks after delivery. Vaginal bleeding can vary from woman to woman. °· Within the first few  days after delivery, you may have breast engorgement. This is when your breasts feel heavy, full, and uncomfortable. Your breasts may also throb and feel hard, tightly stretched, warm, and tender. After this occurs, you may have milk leaking from your breasts. Your health care provider can help you relieve discomfort due to breast engorgement. Breast engorgement should go away within a few days. °· You may feel more sad or worried than normal due to hormonal changes after delivery. These feelings should not last more than a few days. If these feelings do not go away after several days, speak with your health care provider. °How should I care for myself? °· Tell your health care provider if you have pain or discomfort. °· Drink enough water to keep your urine clear or pale yellow. °· Wash your hands thoroughly with soap and water for at least 20 seconds after changing your sanitary pads, after using the toilet, and before holding or feeding your baby. °· If you are not breastfeeding, avoid touching your breasts a lot. Doing this can make your breasts produce more milk. °· If you become weak or lightheaded, or you feel like you might faint, ask for help before: °? Getting out of bed. °? Showering. °· Change your sanitary pads frequently. Watch for any changes in your flow, such as a sudden increase in volume, a change in color, the passing of large blood clots. If you pass a blood clot from your vagina, save it   to show to your health care provider. Do not flush blood clots down the toilet without having your health care provider look at them. °· Make sure that all your vaccinations are up to date. This can help protect you and your baby from getting certain diseases. You may need to have immunizations done before you leave the hospital. °· If desired, talk with your health care provider about methods of family planning or birth control (contraception). °How can I start bonding with my baby? °Spending as much time as  possible with your baby is very important. During this time, you and your baby can get to know each other and develop a bond. Having your baby stay with you in your room (rooming in) can give you time to get to know your baby. Rooming in can also help you become comfortable caring for your baby. Breastfeeding can also help you bond with your baby. °How can I plan for returning home with my baby? °· Make sure that you have a car seat installed in your vehicle. °? Your car seat should be checked by a certified car seat installer to make sure that it is installed safely. °? Make sure that your baby fits into the car seat safely. °· Ask your health care provider any questions you have about caring for yourself or your baby. Make sure that you are able to contact your health care provider with any questions after leaving the hospital. °This information is not intended to replace advice given to you by your health care provider. Make sure you discuss any questions you have with your health care provider. °Document Released: 04/16/2007 Document Revised: 11/22/2015 Document Reviewed: 05/24/2015 °Elsevier Interactive Patient Education © 2018 [employer] Inc[employer] ° °

## 2017-10-23 NOTE — Discharge Summary (Addendum)
OB Discharge Summary     Patient Name: Kathleen Henson DOB: [birth year] MRN: [id number]  Date of admission: 10/22/2017 Delivering MD: Gorden Harms C   Date of discharge: 10/23/2017  Admitting diagnosis: INDUCTION Intrauterine pregnancy: [redacted]w[redacted]d     Secondary diagnosis:  Active Problems:   Gestational diabetes mellitus (GDM) controlled on oral hypoglycemic drug   Spontaneous vaginal delivery   Postpartum care following vaginal delivery  Additional problems: None     Discharge diagnosis: Term Pregnancy Delivered and GDM A2                                                                                                Post partum procedures:none  Augmentation: AROM and Pitocin  Complications: None  Hospital course:  Induction of Labor With Vaginal Delivery   22 y.o. yo G2P1001 at [redacted]w[redacted]d was admitted to the hospital 10/22/2017 for induction of labor.  Indication for induction: A2 DM.  Patient had an uncomplicated labor course as follows: Membrane Rupture Time/Date: 11:20 AM ,10/22/2017   Intrapartum Procedures: Episiotomy: None [1]                                         Lacerations:  2nd degree [3]  Patient had delivery of a Viable infant.  Information for the patient's newborn:  Brittley, Regner Girl Teriann [[id number]]  Delivery Method: Vag-Spont   10/22/2017  Details of delivery can be found in separate delivery note.  Patient had a routine postpartum course. Patient is discharged home 10/23/17.  Physical exam  Vitals:   10/22/17 1430 10/22/17 1530 10/22/17 1930 10/23/17 0452  BP: (!) 105/57 110/65 103/64 105/68  Pulse: (!) 57 68 63 62  Resp: 18 18 18 18   Temp: 98.8 F (37.1 C) (!) 97.4 F (36.3 C) 98.6 F (37 C) 97.9 F (36.6 C)  TempSrc: Oral Oral Oral Oral  SpO2: 100% 100% 98% 98%  Weight: 124 lb (56.2 kg)     Height: 4\' 10"  (1.473 m)      General: alert, cooperative and no distress Lochia: appropriate Uterine Fundus: firm Incision: N/A DVT Evaluation: No evidence of DVT seen  on physical exam. Labs: Lab Results  Component Value Date   WBC 10.0 10/22/2017   HGB 12.1 10/22/2017   HCT 35.8 (L) 10/22/2017   MCV 80.4 10/22/2017   PLT 293 10/22/2017   No flowsheet data found.  Discharge instruction: per After Visit Summary and "Baby and Me Booklet".  After visit meds:  Allergies as of 10/23/2017   No Known Allergies     Medication List    TAKE these medications   ibuprofen 600 MG tablet Commonly known as:  ADVIL,MOTRIN Take 1 tablet (600 mg total) by mouth every 6 (six) hours.   prenatal multivitamin Tabs tablet Take 1 tablet by mouth daily at 12 noon.       Diet: routine diet  Activity: Advance as tolerated. Pelvic rest for 6 weeks.   Outpatient follow up:6 weeks- will need 2hr GTT Follow up  Appt: Future Appointments  Date Time Provider Department Center  12/06/2017  9:30 AM Constant, Gigi GinPeggy, MD [facility] None   Follow up Visit:No follow-ups on file.  Postpartum contraception: Condoms  Newborn Data: Live born female  Birth Weight: 6 lb 10.4 oz (3015 g) APGAR: 8, 9  Newborn Delivery   Birth date/time:  10/22/2017 12:30:00 Delivery type:  Vaginal, Spontaneous     Baby Feeding: Breast and Bottle Disposition:home with mother   Patient will need 2hr GTT at 6 week follow-up appointment.    10/23/2017 Gorden HarmsMegan Campbell, MD  CNM attestation I have seen and examined this patient and agree with above documentation in the resident's note.   Kathleen Henson is a 22 y.o. G2P1001 s/p IOL for GDMA2 resulting in SVD.   Pain is well controlled.  Plan for birth control is condoms.  Method of Feeding: breast  Pt states she has had a Nexplanon in the past and had side effects consisting of a H/A and body aches- refuses to have this placed again.  PE:  BP 105/68 (BP Location: Right Arm)   Pulse 62   Temp 97.9 F (36.6 C) (Oral)   Resp 18   Ht 4\' 10"  (1.473 m)   Wt 56.2 kg (124 lb)   LMP 01/22/2017   SpO2 98%   Breastfeeding? Unknown   BMI 25.92  kg/m  Fundus firm  Recent Labs    10/22/17 0742  HGB 12.1  HCT 35.8*     Plan: discharge today - postpartum care discussed - f/u clinic in 6 weeks for postpartum visit and GTT   Cam HaiSHAW, Angala Hilgers, CNM 1:04 PM 10/23/2017

## 2017-10-23 NOTE — Progress Notes (Signed)
Interpretor used to explained plan of care for mother and baby, medications that are scheduled for the day, assess pain level and to ask if patients had any questions or concerns.

## 2017-10-23 NOTE — Lactation Note (Addendum)
This note was copied from a baby's chart. Lactation Consultation Note Baby 14 hrs old when assessed. Baby BF in cradle position. Baby not close, suckling on tip of nipple. Used [name] interpreter [employer] [phone]. Mom BF her 722 yr old daughter for 6 months. Breast and formula. Mom stated she will do the same with this baby.  Mom has small areola and small everted nipples. Tubular breast. Baby had a shallow latch. Explained to mom that's why she was having pain while nursing. Discussed positions. Assisted in football, used support under mom's hand, baby's cheeks to breast. Mom stated much better. Discussed newborn feeding habits, obtaining deep latch, BF first before giving formula, I&O, STS. Mom focused on her abd. Pain during BF, asking for pain medication. Mom has WIC in [place]. Encouraged to call for questions or concerns. WH/LC brochure given w/resources, support groups and LC services.  Patient Name: Kathleen Henson Today's Date: 10/23/2017 Reason for consult: Initial assessment   Maternal Data Has patient been taught Hand Expression?: Yes Does the patient have breastfeeding experience prior to this delivery?: Yes  Feeding Feeding Type: Breast Fed Length of feed: 20 min  LATCH Score Latch: Grasps breast easily, tongue down, lips flanged, rhythmical sucking.  Audible Swallowing: Spontaneous and intermittent  Type of Nipple: Everted at rest and after stimulation  Comfort (Breast/Nipple): Filling, red/small blisters or bruises, mild/mod discomfort  Hold (Positioning): Assistance needed to correctly position infant at breast and maintain latch.  LATCH Score: 8  Interventions Interventions: Breast feeding basics reviewed;Support pillows;Assisted with latch;Position options;Skin to skin;Breast massage;Hand express;Breast compression;Adjust position  Lactation Tools Discussed/Used WIC Program: Yes   Consult Status Consult Status: Follow-up Date: 10/24/17 Follow-up type:  In-patient    Charyl DancerCARVER, Kylen Ismael G 10/23/2017, 3:34 AM

## 2017-10-26 ENCOUNTER — Encounter: Payer: Medicaid Other | Admitting: Family Medicine

## 2017-12-06 ENCOUNTER — Encounter: Payer: Self-pay | Admitting: Obstetrics and Gynecology

## 2017-12-06 ENCOUNTER — Ambulatory Visit (INDEPENDENT_AMBULATORY_CARE_PROVIDER_SITE_OTHER): Payer: Medicaid Other | Admitting: Obstetrics and Gynecology

## 2017-12-06 DIAGNOSIS — Z1389 Encounter for screening for other disorder: Secondary | ICD-10-CM | POA: Diagnosis not present

## 2017-12-06 DIAGNOSIS — Z8632 Personal history of gestational diabetes: Secondary | ICD-10-CM

## 2017-12-06 NOTE — Progress Notes (Signed)
Subjective:     Kathleen Henson is a 22 y.o. female who presents for a postpartum visit. She is 6 weeks postpartum following a spontaneous vaginal delivery. I have fully reviewed the prenatal and intrapartum course. The delivery was at 39 gestational weeks. Outcome: spontaneous vaginal delivery. Anesthesia: local. Postpartum course has been uncomplicated. Baby's course has been uncomplicated. Baby is feeding by both breast and bottle - Similac Advance. Bleeding no bleeding. Bowel function is normal. Bladder function is normal. Patient is not sexually active. Contraception method is condoms. Postpartum depression screening: negative.     Review of Systems Pertinent items are noted in HPI.   Objective:    There were no vitals taken for this visit.  General:  alert, cooperative and no distress   Breasts:  inspection negative, no nipple discharge or bleeding, no masses or nodularity palpable  Lungs: clear to auscultation bilaterally  Heart:  regular rate and rhythm  Abdomen: soft, non-tender; bowel sounds normal; no masses,  no organomegaly   Vulva:  normal  Vagina: normal vagina, no discharge, exudate, lesion, or erythema  Cervix:  multiparous appearance  Corpus: normal size, contour, position, consistency, mobility, non-tender  Adnexa:  normal adnexa and no mass, fullness, tenderness  Rectal Exam: Not performed.        Assessment:     Normal postpartum exam. Pap smear not done at today's visit.   Plan:    1. Contraception: condoms 2. Patient is medically cleared to resume all activities of daily living. 2 hour glucola today given GDMA2 3. Follow up in: 1 year or as needed.

## 2017-12-07 LAB — GLUCOSE TOLERANCE, 2 HOURS
Glucose, 2 hour: 141 mg/dL — ABNORMAL HIGH (ref 65–139)
Glucose, GTT - Fasting: 79 mg/dL (ref 65–99)

## 2017-12-10 ENCOUNTER — Telehealth: Payer: Self-pay

## 2017-12-10 NOTE — Telephone Encounter (Signed)
-----   Message from Catalina AntiguaPeggy Constant, MD sent at 12/07/2017 10:46 AM EDT ----- Please inform patient of persistent diabetes and need to follow up with PCP. In the meantime, she should continue her carb modified diet which she started during her pregnancy

## 2017-12-10 NOTE — Telephone Encounter (Signed)
Attempted to reach patient unable to leave message. Armandina StammerJennifer Jeremiah Curci RN

## 2017-12-18 ENCOUNTER — Telehealth: Payer: Self-pay

## 2017-12-18 NOTE — Telephone Encounter (Signed)
Left message for patient to return call to office. Jennifer Howard  RN 

## 2017-12-18 NOTE — Telephone Encounter (Signed)
-----   Message from Peggy Constant, MD sent at 12/07/2017 10:46 AM EDT ----- Please inform patient of persistent diabetes and need to follow up with PCP. In the meantime, she should continue her carb modified diet which she started during her pregnancy 

## 2017-12-24 NOTE — Telephone Encounter (Signed)
Called pt. Pt's husband answered and states that he works from 5am until late in the evening and his wife does not have access to a phone at home. Letter was sent to patient.

## 2019-07-04 NOTE — L&D Delivery Note (Addendum)
OB/GYN Faculty Practice Delivery Note  Kathleen Henson is a 24 y.o. [id number] s/p precipitous vaginal delivery at [redacted]w[redacted]d. She was admitted for imminent delivery.   ROM: 0h 73m with clear fluid 06/04/2020 at 0813 GBS Status: Negative  Labor Progress: Precipitous delivery  Delivery Date/Time: 06/04/2020 at 0820 Delivery: Called to room and patient was complete and pushing. Head delivered direct OA. No nuchal cord present. Shoulder and body delivered in usual fashion. Infant with spontaneous cry, placed on mother's abdomen, dried and stimulated. Cord clamped x 2 after 1-minute delay, and cut by FOB. Cord blood drawn. Placenta delivered spontaneously, intact, with 3-vessel cord. Fundus firm with massage and Pitocin. Labia, perineum, vagina, and cervix inspected, perineal abrasion found and repaired not indicated.   Placenta: 12/03/2021at 4166 Complications: precipitous delivery Lacerations: perineal abrasion EBL: 100 Analgesia: none  Postpartum Planning [x]  transfer orders to MB [x]  discharge summary started & shared [x]  message to sent to schedule follow-up  [x]  lists updated [x]  vaccines UTD  Infant: girl  APGARs 9/9  3250 g  , [facility] Student [facility] 06/04/2020, 8:41 AM  I was present and gloved with the student for the entire delivery. I agree with the note above.  DNP, CNM  06/04/20  8:55 AM

## 2019-09-16 ENCOUNTER — Encounter (HOSPITAL_COMMUNITY): Payer: Self-pay | Admitting: Emergency Medicine

## 2019-09-16 ENCOUNTER — Telehealth: Payer: Self-pay

## 2019-09-16 ENCOUNTER — Emergency Department (HOSPITAL_COMMUNITY)
Admission: EM | Admit: 2019-09-16 | Discharge: 2019-09-16 | Disposition: A | Discharge status: Home / Self Care | Payer: Medicaid Other | Attending: Emergency Medicine | Admitting: Emergency Medicine

## 2019-09-16 ENCOUNTER — Other Ambulatory Visit: Payer: Self-pay

## 2019-09-16 DIAGNOSIS — R1030 Lower abdominal pain, unspecified: Secondary | ICD-10-CM | POA: Diagnosis not present

## 2019-09-16 DIAGNOSIS — Z3202 Encounter for pregnancy test, result negative: Secondary | ICD-10-CM | POA: Diagnosis not present

## 2019-09-16 DIAGNOSIS — N939 Abnormal uterine and vaginal bleeding, unspecified: Secondary | ICD-10-CM

## 2019-09-16 DIAGNOSIS — E119 Type 2 diabetes mellitus without complications: Secondary | ICD-10-CM | POA: Insufficient documentation

## 2019-09-16 DIAGNOSIS — Z79899 Other long term (current) drug therapy: Secondary | ICD-10-CM | POA: Insufficient documentation

## 2019-09-16 LAB — COMPREHENSIVE METABOLIC PANEL
ALT: 14 U/L (ref 0–44)
AST: 17 U/L (ref 15–41)
Albumin: 4.2 g/dL (ref 3.5–5.0)
Alkaline Phosphatase: 76 U/L (ref 38–126)
Anion gap: 12 (ref 5–15)
BUN: 8 mg/dL (ref 6–20)
CO2: 23 mmol/L (ref 22–32)
Calcium: 9.3 mg/dL (ref 8.9–10.3)
Chloride: 102 mmol/L (ref 98–111)
Creatinine, Ser: 0.52 mg/dL (ref 0.44–1.00)
GFR calc Af Amer: >60 mL/min (ref >60)
GFR calc non Af Amer: >60 mL/min (ref >60)
Glucose, Bld: 90 mg/dL (ref 70–99)
Potassium: 3.8 mmol/L (ref 3.5–5.1)
Sodium: 137 mmol/L (ref 135–145)
Total Bilirubin: 0.8 mg/dL (ref 0.3–1.2)
Total Protein: 7.6 g/dL (ref 6.5–8.1)

## 2019-09-16 LAB — CBC WITH DIFFERENTIAL/PLATELET
Abs Immature Granulocytes: 0.02 10*3/uL (ref 0.00–0.07)
Basophils Absolute: 0 10*3/uL (ref 0.0–0.1)
Basophils Relative: 0 %
Eosinophils Absolute: 0.1 10*3/uL (ref 0.0–0.5)
Eosinophils Relative: 1 %
HCT: 42.4 % (ref 36.0–46.0)
Hemoglobin: 14.1 g/dL (ref 12.0–15.0)
Immature Granulocytes: 0 %
Lymphocytes Relative: 34 %
Lymphs Abs: 2.8 10*3/uL (ref 0.7–4.0)
MCH: 29.4 pg (ref 26.0–34.0)
MCHC: 33.3 g/dL (ref 30.0–36.0)
MCV: 88.3 fL (ref 80.0–100.0)
Monocytes Absolute: 0.4 10*3/uL (ref 0.1–1.0)
Monocytes Relative: 4 %
Neutro Abs: 5.1 10*3/uL (ref 1.7–7.7)
Neutrophils Relative %: 61 %
Platelets: 354 10*3/uL (ref 150–400)
RBC: 4.8 MIL/uL (ref 3.87–5.11)
RDW: 11.7 % (ref 11.5–15.5)
WBC: 8.4 10*3/uL (ref 4.0–10.5)
nRBC: 0 % (ref 0.0–0.2)

## 2019-09-16 LAB — URINALYSIS, ROUTINE W REFLEX MICROSCOPIC

## 2019-09-16 LAB — URINALYSIS, MICROSCOPIC (REFLEX): RBC / HPF: >50 RBC/hpf (ref 0–5)

## 2019-09-16 LAB — I-STAT BETA HCG BLOOD, ED (MC, WL, AP ONLY): I-stat hCG, quantitative: <5 m[IU]/mL (ref <5)

## 2019-09-16 MED ORDER — IBUPROFEN 400 MG PO TABS
400.0000 mg | ORAL_TABLET | Freq: Once | ORAL | Status: AC
  Administered 2019-09-16: 400 mg via ORAL
  Filled 2019-09-16: qty 1

## 2019-09-16 MED ORDER — OXYCODONE-ACETAMINOPHEN 5-325 MG PO TABS
1.0000 | ORAL_TABLET | Freq: Once | ORAL | Status: AC
  Administered 2019-09-16: 1 via ORAL
  Filled 2019-09-16: qty 1

## 2019-09-16 NOTE — ED Notes (Signed)
Patient verbalizes understanding of discharge instructions . Opportunity for questions and answers were provided . Armband removed by staff ,Pt discharged from ED. W/C  offered at D/C  and Declined W/C at D/C and was escorted to lobby by RN.  

## 2019-09-16 NOTE — Telephone Encounter (Signed)
Pt's husband called the office stating pt is pregnant and has been bleeding heavy since yesterday. Advised pt's husband to take pt to [facility] to be seen. Understanding was voiced.  Hilman Kissling l Champagne Paletta, CMA

## 2019-09-16 NOTE — ED Provider Notes (Signed)
[facility] EMERGENCY DEPARTMENT Provider Note   CSN: [id number] Arrival date & time: 09/16/19  1158     History Chief Complaint  Patient presents with  . Vaginal Bleeding    Kathleen Henson is a 24 y.o. female.  HPI    Level 5 caveat secondary to language barrier Language is Clydie Braun and obtained through translator 24 year old female G64, P2 LMP December 27 with positive home pregnancy test in January.  She reports that she had some bleeding for a couple days in February.  Yesterday she began having more bleeding and is having some crampy lower abdominal pain.  She has been through 4 pads today.  She has not taken anything for pain.  Her first 2 pregnancies were normal with the first child being delivered [employer] and her 59-year-old delivered in [place].  She has not sought OB care yet with this pregnancy.  She denies any other medical problems or medications. Past Medical History:  Diagnosis Date  . Diabetes mellitus without complication (HCC)   . Medical history non-contributory     Patient Active Problem List   Diagnosis Date Noted  . Gestational diabetes mellitus (GDM) controlled on oral hypoglycemic drug 10/22/2017  . Spontaneous vaginal delivery 10/22/2017  . Postpartum care following vaginal delivery 10/22/2017  . [redacted] weeks gestation of pregnancy   . Encounter for antenatal screening for malformations   . Supervision of other high risk pregnancies, third trimester 08/03/2017  . Gestational diabetes mellitus (GDM) affecting pregnancy 08/03/2017  . Language barrier affecting health care 08/03/2017    Past Surgical History:  Procedure Laterality Date  . NO PAST SURGERIES       OB History    Gravida  2   Para  1   Term  1   Preterm      AB      Living  1     SAB      TAB      Ectopic      Multiple      Live Births  1           Family History  Problem Relation Age of Onset  . Cancer Neg Hx   . Hypertension Neg Hx   .  Diabetes Neg Hx     Social History   Tobacco Use  . Smoking status: Never Smoker  . Smokeless tobacco: Never Used  Substance Use Topics  . Alcohol use: No  . Drug use: No    Home Medications Prior to Admission medications   Medication Sig Start Date End Date Taking? Authorizing Provider  ibuprofen (ADVIL,MOTRIN) 600 MG tablet Take 1 tablet (600 mg total) by mouth every 6 (six) hours as needed. 10/23/17   Arabella Merles, CNM  Prenatal Vit-Fe Fumarate-FA (PRENATAL MULTIVITAMIN) TABS tablet Take 1 tablet by mouth daily at 12 noon. 10/23/17   Lise Auer, MD    Allergies    Patient has no known allergies.  Review of Systems   Review of Systems  All other systems reviewed and are negative.   Physical Exam Updated Vital Signs BP 127/76 (BP Location: Right Arm)   Pulse (!) 111   Temp (!) 97.5 F (36.4 C) (Oral)   Resp 20   Ht 1.524 m (5')   Wt 54.4 kg   LMP 06/25/2019   SpO2 99%   BMI 23.44 kg/m   Physical Exam Vitals and nursing note reviewed.  Constitutional:      Appearance: Normal appearance.  She is normal weight. She is not ill-appearing.  HENT:     Head: Normocephalic.     Right Ear: External ear normal.     Left Ear: External ear normal.     Mouth/Throat:     Mouth: Mucous membranes are moist.  Eyes:     Pupils: Pupils are equal, round, and reactive to light.  Cardiovascular:     Rate and Rhythm: Normal rate and regular rhythm.  Pulmonary:     Effort: Pulmonary effort is normal.  Abdominal:     General: Abdomen is flat.     Palpations: Abdomen is soft.  Genitourinary:    Comments: Speculum exam performed with some blood in vaginal vault but no active bleeding observed No cervical motion tenderness There is some diffuse tenderness palpated on bimanual exam with some enlargement of uterus Musculoskeletal:     Cervical back: Normal range of motion.  Skin:    General: Skin is warm.     Capillary Refill: Capillary refill takes less than 2 seconds.   Neurological:     General: No focal deficit present.     Mental Status: She is alert.  Psychiatric:        Mood and Affect: Mood normal.     ED Results / Procedures / Treatments   Labs (all labs ordered are listed, but only abnormal results are displayed) Labs Reviewed  CBC WITH DIFFERENTIAL/PLATELET  COMPREHENSIVE METABOLIC PANEL  URINALYSIS, ROUTINE W REFLEX MICROSCOPIC  I-STAT BETA HCG BLOOD, ED ([facility], [facility], [facility] ONLY)  I-STAT CHEM 8, ED  I-STAT BETA HCG BLOOD, ED ([facility], [facility], [facility] ONLY)    EKG None  Radiology No results found.  Procedures Procedures (including critical care time)  Medications Ordered in ED Medications - No data to display  ED Course  I have reviewed the triage vital signs and the nursing notes.  Pertinent labs & imaging results that were available during my care of the patient were reviewed by me and considered in my medical decision making (see chart for details).    MDM Rules/Calculators/A&P                     24 year old G3 P2 positive pregnancy test and December presents today with vaginal bleeding.  Here she has a negative pregnancy test.  On bimanual exam she has mild tenderness with some vaginal bleeding that seems more consistent with a regular menstrual cycle.  She appears stable for discharge and will be referred to GYN for follow-up.  Final Clinical Impression(s) / ED Diagnoses Final diagnoses:  Vaginal bleeding    Rx / DC Orders ED Discharge Orders    None       Pattricia Boss, MD 09/16/19 1520

## 2019-09-16 NOTE — ED Triage Notes (Addendum)
Onset today intermittent vaginal bleeding with lower abdomain pain cramping. LMP 06/25/2019. HCG completed. Will be evaluated in ED. States happened last month and resolved. Used [employer]atus ipad to communicate language Clydie Braun.

## 2019-09-16 NOTE — Discharge Instructions (Addendum)
You have a negative pregnancy test today. Please call your gynecologist for recheck this week. Return to the ED if worsening symptoms

## 2019-11-18 ENCOUNTER — Ambulatory Visit (INDEPENDENT_AMBULATORY_CARE_PROVIDER_SITE_OTHER): Payer: Medicaid Other | Admitting: Advanced Practice Midwife

## 2019-11-18 ENCOUNTER — Encounter: Payer: Self-pay | Admitting: Advanced Practice Midwife

## 2019-11-18 ENCOUNTER — Other Ambulatory Visit (HOSPITAL_COMMUNITY)
Admission: RE | Admit: 2019-11-18 | Discharge: 2019-11-18 | Disposition: A | Discharge status: Home / Self Care | Payer: Medicaid Other | Source: Ambulatory Visit | Attending: Advanced Practice Midwife | Admitting: Advanced Practice Midwife

## 2019-11-18 ENCOUNTER — Other Ambulatory Visit: Payer: Self-pay

## 2019-11-18 VITALS — BP 99/59 | HR 86 | Wt 108.1 lb

## 2019-11-18 DIAGNOSIS — Z8751 Personal history of pre-term labor: Secondary | ICD-10-CM

## 2019-11-18 DIAGNOSIS — Z3480 Encounter for supervision of other normal pregnancy, unspecified trimester: Secondary | ICD-10-CM | POA: Insufficient documentation

## 2019-11-18 DIAGNOSIS — Z3481 Encounter for supervision of other normal pregnancy, first trimester: Secondary | ICD-10-CM

## 2019-11-18 DIAGNOSIS — Z8632 Personal history of gestational diabetes: Secondary | ICD-10-CM

## 2019-11-18 DIAGNOSIS — Z3A09 9 weeks gestation of pregnancy: Secondary | ICD-10-CM

## 2019-11-18 LAB — POCT URINE PREGNANCY: Preg Test, Ur: POSITIVE — AB

## 2019-11-18 MED ORDER — DOXYLAMINE-PYRIDOXINE 10-10 MG PO TBEC: 2.0000 | DELAYED_RELEASE_TABLET | Freq: Every evening | ORAL | 2 refills | Status: DC | PRN

## 2019-11-18 NOTE — Progress Notes (Signed)
Subjective:    Kathleen Henson is a [id number] [redacted]w[redacted]d being seen today for her first obstetrical visit.  Her obstetrical history is significant for Language Barrier, Closely Space Pregnancies. Patient does intend to breast feed. Pregnancy history fully reviewed.  Patient reports nausea.  Vitals:   11/18/19 1010  BP: (!) 99/59  Pulse: 86  Weight: 108 lb 1.9 oz (49 kg)    HISTORY: OB History  Gravida Para Term Preterm AB Living  4 2 2   1 2   SAB TAB Ectopic Multiple Live Births  1       2    # Outcome Date GA Lbr Len/2nd Weight Sex Delivery Anes PTL Lv  4 Current           3 SAB 2021          2 Term 2019 [redacted]w[redacted]d    Vag-Spont  N LIV  1 Term 2016   7 lb 1 oz (3.204 kg) F Vag-Spont   LIV   Past Medical History:  Diagnosis Date  . Diabetes mellitus without complication (HCC)   . Medical history non-contributory    Past Surgical History:  Procedure Laterality Date  . NO PAST SURGERIES     Family History  Problem Relation Age of Onset  . Cancer Neg Hx   . Hypertension Neg Hx   . Diabetes Neg Hx      Exam    Uterus:     Pelvic Exam:    Perineum: No Hemorrhoids, Normal Perineum   Vulva: Bartholin's, Urethra, Skene's normal   Vagina:  normal discharge   pH:    Cervix: no bleeding following Pap   Adnexa: normal adnexa and no mass, fullness, tenderness   Bony Pelvis: gynecoid  System: Breast:  normal appearance, no masses or tenderness   Skin: normal coloration and turgor, no rashes    Neurologic: oriented, grossly non-focal   Extremities: normal strength, tone, and muscle mass   HEENT neck supple with midline trachea   Mouth/Teeth mucous membranes moist, pharynx normal without lesions   Neck supple   Cardiovascular: regular rate and rhythm   Respiratory:  appears well, vitals normal, no respiratory distress, acyanotic, normal RR, ear and throat exam is normal, neck free of mass or lymphadenopathy, chest clear, no wheezing, crepitations, rhonchi, normal symmetric air entry   Abdomen: soft, non-tender; bowel sounds normal; no masses,  no organomegaly   Urinary: urethral meatus normal      Assessment:    Pregnancy: 2017 Patient Active Problem List   Diagnosis Date Noted  . Supervision of other normal pregnancy, antepartum 11/18/2019  . Gestational diabetes mellitus (GDM) controlled on oral hypoglycemic drug 10/22/2017  . Spontaneous vaginal delivery 10/22/2017  . Postpartum care following vaginal delivery 10/22/2017  . [redacted] weeks gestation of pregnancy   . Encounter for antenatal screening for malformations   . Supervision of other high risk pregnancies, third trimester 08/03/2017  . Gestational diabetes mellitus (GDM) affecting pregnancy 08/03/2017  . Language barrier affecting health care 08/03/2017        Plan:    Translator used  Initial labs drawn. Will add 1 hour Glucola and HgbA1C to evaluate for Type 2 DM or Gestatational DM.  Repeat at 26 weeks if normal    If abnormal, begin testing and diet Prenatal vitamins.  Discussed and offered genetic screening options, including Quad screen/AFP, NIPS testing, and option to decline testing. Benefits/risks/alternatives reviewed. Pt aware that anatomy 10/01/2017 is form of genetic screening with lower accuracy  in detecting trisomies than blood work.  Pt chooses genetic screening today. NIPS: ordered. Ultrasound discussed; fetal anatomic survey: ordered. Problem list reviewed and updated. The nature of [facility] with multiple MDs and other Advanced Practice Providers was explained to patient; also emphasized that residents, students are part of our team. Routine obstetric precautions reviewed.  Problem list reviewed and updated.50% of 30 min visit spent on counseling and coordination of care.   RTO 4 weeks for Panorama and AFP   Hansel Feinstein 11/18/2019

## 2019-11-18 NOTE — Progress Notes (Signed)
Interpreter N[employer] [phone].

## 2019-11-18 NOTE — Patient Instructions (Signed)
First Trimester of Pregnancy  The first trimester of pregnancy is from week 1 until the end of week 13 (months 1 through 3). During this time, your baby will begin to develop inside you. At 6-8 weeks, the eyes and face are formed, and the heartbeat can be seen on ultrasound. At the end of 12 weeks, all the baby's organs are formed. Prenatal care is all the medical care you receive before the birth of your baby. Make sure you get good prenatal care and follow all of your doctor's instructions. Follow these instructions at home: Medicines  Take over-the-counter and prescription medicines only as told by your doctor. Some medicines are safe and some medicines are not safe during pregnancy.  Take a prenatal vitamin that contains at least 600 micrograms (mcg) of folic acid.  If you have trouble pooping (constipation), take medicine that will make your stool soft (stool softener) if your doctor approves. Eating and drinking   Eat regular, healthy meals.  Your doctor will tell you the amount of weight gain that is right for you.  Avoid raw meat and uncooked cheese.  If you feel sick to your stomach (nauseous) or throw up (vomit): ? Eat 4 or 5 small meals a day instead of 3 large meals. ? Try eating a few soda crackers. ? Drink liquids between meals instead of during meals.  To prevent constipation: ? Eat foods that are high in fiber, like fresh fruits and vegetables, whole grains, and beans. ? Drink enough fluids to keep your pee (urine) clear or pale yellow. Activity  Exercise only as told by your doctor. Stop exercising if you have cramps or pain in your lower belly (abdomen) or low back.  Do not exercise if it is too hot, too humid, or if you are in a place of great height (high altitude).  Try to avoid standing for long periods of time. Move your legs often if you must stand in one place for a long time.  Avoid heavy lifting.  Wear low-heeled shoes. Sit and stand up  straight.  You can have sex unless your doctor tells you not to. Relieving pain and discomfort  Wear a good support bra if your breasts are sore.  Take warm water baths (sitz baths) to soothe pain or discomfort caused by hemorrhoids. Use hemorrhoid cream if your doctor says it is okay.  Rest with your legs raised if you have leg cramps or low back pain.  If you have puffy, bulging veins (varicose veins) in your legs: ? Wear support hose or compression stockings as told by your doctor. ? Raise (elevate) your feet for 15 minutes, 3-4 times a day. ? Limit salt in your food. Prenatal care  Schedule your prenatal visits by the twelfth week of pregnancy.  Write down your questions. Take them to your prenatal visits.  Keep all your prenatal visits as told by your doctor. This is important. Safety  Wear your seat belt at all times when driving.  Make a list of emergency phone numbers. The list should include numbers for family, friends, the hospital, and police and fire departments. General instructions  Ask your doctor for a referral to a local prenatal class. Begin classes no later than at the start of month 6 of your pregnancy.  Ask for help if you need counseling or if you need help with nutrition. Your doctor can give you advice or tell you where to go for help.  Do not use hot tubs, steam   rooms, or saunas.  Do not douche or use tampons or scented sanitary pads.  Do not cross your legs for long periods of time.  Avoid all herbs and alcohol. Avoid drugs that are not approved by your doctor.  Do not use any tobacco products, including cigarettes, chewing tobacco, and electronic cigarettes. If you need help quitting, ask your doctor. You may get counseling or other support to help you quit.  Avoid cat litter boxes and soil used by cats. These carry germs that can cause birth defects in the baby and can cause a loss of your baby (miscarriage) or stillbirth.  Visit your dentist.  At home, brush your teeth with a soft toothbrush. Be gentle when you floss. Contact a doctor if:  You are dizzy.  You have mild cramps or pressure in your lower belly.  You have a nagging pain in your belly area.  You continue to feel sick to your stomach, you throw up, or you have watery poop (diarrhea).  You have a bad smelling fluid coming from your vagina.  You have pain when you pee (urinate).  You have increased puffiness (swelling) in your face, hands, legs, or ankles. Get help right away if:  You have a fever.  You are leaking fluid from your vagina.  You have spotting or bleeding from your vagina.  You have very bad belly cramping or pain.  You gain or lose weight rapidly.  You throw up blood. It may look like coffee grounds.  You are around people who have German measles, fifth disease, or chickenpox.  You have a very bad headache.  You have shortness of breath.  You have any kind of trauma, such as from a fall or a car accident. Summary  The first trimester of pregnancy is from week 1 until the end of week 13 (months 1 through 3).  To take care of yourself and your unborn baby, you will need to eat healthy meals, take medicines only if your doctor tells you to do so, and do activities that are safe for you and your baby.  Keep all follow-up visits as told by your doctor. This is important as your doctor will have to ensure that your baby is healthy and growing well. This information is not intended to replace advice given to you by your health care provider. Make sure you discuss any questions you have with your health care provider. Document Revised: 10/10/2018 Document Reviewed: 06/27/2016 Elsevier Patient Education  2020 Elsevier Inc.  

## 2019-11-19 ENCOUNTER — Encounter: Payer: Self-pay | Admitting: Advanced Practice Midwife

## 2019-11-19 DIAGNOSIS — Z8632 Personal history of gestational diabetes: Secondary | ICD-10-CM | POA: Insufficient documentation

## 2019-11-19 DIAGNOSIS — O24410 Gestational diabetes mellitus in pregnancy, diet controlled: Secondary | ICD-10-CM | POA: Insufficient documentation

## 2019-11-19 DIAGNOSIS — E119 Type 2 diabetes mellitus without complications: Secondary | ICD-10-CM | POA: Insufficient documentation

## 2019-11-19 LAB — CYTOLOGY - PAP
Chlamydia: NEGATIVE
Comment: NEGATIVE
Comment: NORMAL
Diagnosis: NEGATIVE
Neisseria Gonorrhea: NEGATIVE

## 2019-11-19 LAB — GLUCOSE TOLERANCE, 1 HOUR: Glucose, 1Hr PP: 214 mg/dL — ABNORMAL HIGH (ref 65–199)

## 2019-11-19 LAB — PROTEIN / CREATININE RATIO, URINE
Creatinine, Urine: 163 mg/dL
Protein, Ur: 46.8 mg/dL
Protein/Creat Ratio: 287 mg/g creat — ABNORMAL HIGH (ref 0–200)

## 2019-11-20 LAB — HEMOGLOBPATHY+FER W/A THAL RFX
Ferritin: 128 ng/mL (ref 15–150)
Hgb A2: 2.8 % (ref 1.8–3.2)
Hgb A: 97.2 % (ref 96.4–98.8)
Hgb F: 0 % (ref 0.0–2.0)
Hgb S: 0 %

## 2019-11-20 LAB — OBSTETRIC PANEL, INCLUDING HIV
Antibody Screen: NEGATIVE
Basophils Absolute: 0 10*3/uL (ref 0.0–0.2)
Basos: 0 %
EOS (ABSOLUTE): 0 10*3/uL (ref 0.0–0.4)
Eos: 0 %
HIV Screen 4th Generation wRfx: NONREACTIVE
Hematocrit: 34.4 % (ref 34.0–46.6)
Hemoglobin: 11.8 g/dL (ref 11.1–15.9)
Hepatitis B Surface Ag: NEGATIVE
Immature Grans (Abs): 0 10*3/uL (ref 0.0–0.1)
Immature Granulocytes: 0 %
Lymphocytes Absolute: 2.1 10*3/uL (ref 0.7–3.1)
Lymphs: 21 %
MCH: 29.7 pg (ref 26.6–33.0)
MCHC: 34.3 g/dL (ref 31.5–35.7)
MCV: 87 fL (ref 79–97)
Monocytes Absolute: 0.4 10*3/uL (ref 0.1–0.9)
Monocytes: 4 %
Neutrophils Absolute: 7.4 10*3/uL — ABNORMAL HIGH (ref 1.4–7.0)
Neutrophils: 75 %
Platelets: 307 10*3/uL (ref 150–450)
RBC: 3.97 x10E6/uL (ref 3.77–5.28)
RDW: 11.8 % (ref 11.7–15.4)
RPR Ser Ql: NONREACTIVE
Rh Factor: POSITIVE
Rubella Antibodies, IGG: 3.21 index (ref >0.99)
WBC: 10 10*3/uL (ref 3.4–10.8)

## 2019-11-20 LAB — COMPREHENSIVE METABOLIC PANEL
ALT: 11 IU/L (ref 0–32)
AST: 12 IU/L (ref 0–40)
Albumin/Globulin Ratio: 1.6 (ref 1.2–2.2)
Albumin: 4.1 g/dL (ref 3.9–5.0)
Alkaline Phosphatase: 63 IU/L (ref 48–121)
BUN/Creatinine Ratio: 11 (ref 9–23)
BUN: 5 mg/dL — ABNORMAL LOW (ref 6–20)
Bilirubin Total: 0.2 mg/dL (ref 0.0–1.2)
CO2: 18 mmol/L — ABNORMAL LOW (ref 20–29)
Calcium: 8.8 mg/dL (ref 8.7–10.2)
Chloride: 102 mmol/L (ref 96–106)
Creatinine, Ser: 0.47 mg/dL — ABNORMAL LOW (ref 0.57–1.00)
GFR calc Af Amer: 163 mL/min/{1.73_m2} (ref >59)
GFR calc non Af Amer: 142 mL/min/{1.73_m2} (ref >59)
Globulin, Total: 2.5 g/dL (ref 1.5–4.5)
Glucose: 206 mg/dL — ABNORMAL HIGH (ref 65–99)
Potassium: 3.6 mmol/L (ref 3.5–5.2)
Sodium: 136 mmol/L (ref 134–144)
Total Protein: 6.6 g/dL (ref 6.0–8.5)

## 2019-11-20 LAB — CULTURE, OB URINE

## 2019-11-20 LAB — HEMOGLOBIN A1C
Est. average glucose Bld gHb Est-mCnc: 108 mg/dL
Hgb A1c MFr Bld: 5.4 % (ref 4.8–5.6)

## 2019-11-20 LAB — URINE CULTURE, OB REFLEX

## 2019-11-24 ENCOUNTER — Telehealth: Payer: Self-pay

## 2019-11-24 DIAGNOSIS — O99810 Abnormal glucose complicating pregnancy: Secondary | ICD-10-CM

## 2019-11-24 NOTE — Telephone Encounter (Signed)
-----   Message from Aviva Signs, CNM sent at 11/19/2019 11:50 PM EDT ----- Regarding: needs diab appt Glucola 214    needs to see the diabetes people  Thanks Hilda Lias

## 2019-12-16 ENCOUNTER — Encounter: Payer: Self-pay | Admitting: Advanced Practice Midwife

## 2019-12-16 ENCOUNTER — Encounter: Payer: Medicaid Other | Attending: Advanced Practice Midwife | Admitting: Registered"

## 2019-12-16 ENCOUNTER — Ambulatory Visit: Payer: Medicaid Other | Admitting: Registered"

## 2019-12-16 ENCOUNTER — Other Ambulatory Visit: Payer: Self-pay

## 2019-12-16 ENCOUNTER — Ambulatory Visit (INDEPENDENT_AMBULATORY_CARE_PROVIDER_SITE_OTHER): Payer: Medicaid Other | Admitting: Advanced Practice Midwife

## 2019-12-16 VITALS — BP 101/68 | HR 94 | Wt 109.0 lb

## 2019-12-16 DIAGNOSIS — E119 Type 2 diabetes mellitus without complications: Secondary | ICD-10-CM

## 2019-12-16 DIAGNOSIS — Z3A13 13 weeks gestation of pregnancy: Secondary | ICD-10-CM

## 2019-12-16 DIAGNOSIS — O24419 Gestational diabetes mellitus in pregnancy, unspecified control: Secondary | ICD-10-CM | POA: Diagnosis present

## 2019-12-16 DIAGNOSIS — O99810 Abnormal glucose complicating pregnancy: Secondary | ICD-10-CM | POA: Insufficient documentation

## 2019-12-16 DIAGNOSIS — O09891 Supervision of other high risk pregnancies, first trimester: Secondary | ICD-10-CM

## 2019-12-16 DIAGNOSIS — Z713 Dietary counseling and surveillance: Secondary | ICD-10-CM | POA: Diagnosis not present

## 2019-12-16 DIAGNOSIS — O09899 Supervision of other high risk pregnancies, unspecified trimester: Secondary | ICD-10-CM | POA: Insufficient documentation

## 2019-12-16 MED ORDER — GLUCOSE BLOOD VI STRP: ORAL_STRIP | 12 refills | Status: DC

## 2019-12-16 MED ORDER — ACCU-CHEK SOFTCLIX LANCETS MISC: 12 refills | Status: DC

## 2019-12-16 MED ORDER — ACCU-CHEK GUIDE W/DEVICE KIT: PACK | 0 refills | Status: DC

## 2019-12-16 NOTE — Progress Notes (Signed)
Interpreter services provided by [employer] from [phone]  Patient was seen on 12/16/19 for Gestational Diabetes self-management. EDD 06/19/20. Patient states prior history of GDM. Diet history obtained. Patient eats variety of all food groups. Beverages include water, soda, juice.  Patient is likely consuming excess carbohydrates in the form of rice and beverages.  Pt states her sister has also been diagnosed with GDM and states she can get help from her if she needs more support.   The following learning objectives were met by the patient :   States the definition of Gestational Diabetes  States why dietary management is important in controlling blood glucose  Describes the effects of carbohydrates on blood glucose levels  Demonstrates ability to create a balanced meal plan  Demonstrates carbohydrate counting   States when to check blood glucose levels  Demonstrates proper blood glucose monitoring techniques  States the effect of stress and exercise on blood glucose levels  States the importance of limiting caffeine and abstaining from alcohol and smoking  Plan:  Aim for 3 Carbohydrate Choices per meal (45 grams) +/- 1 either way  Aim for 1-2 Carbohydrate Choices per snack Begin reading food labels for Total Carbohydrate of foods If OK with your MD, consider  increasing your activity level by walking, Arm Chair Exercises or other activity daily as tolerated Begin checking Blood Glucose before breakfast and 2 hours after first bite of breakfast, lunch and dinner as directed by MD  Bring Log Book/Sheet and meter to every medical appointment  Baby Scripts: Patient not appropriate for Baby Scripts due to language barrier  Take medication if directed by MD  Patient picked up meter before visit and learned how to use during visit: CBG: 79 mg/dL  Patient instructed to monitor glucose levels: FBS: 60 - 95 mg/dl 2 hour: <120 mg/dl  Patient received the following handouts:  Nutrition  Diabetes and Pregnancy  Carbohydrate Counting List  Blood glucose Log Sheet  Patient will be seen for follow-up in as needed.

## 2019-12-16 NOTE — Progress Notes (Signed)
   PRENATAL VISIT NOTE  Subjective:  Kathleen Henson is a 24 y.o. [id number] at 40w3dbeing seen today for ongoing prenatal care.  She is currently monitored for the following issues for this high-risk pregnancy and has Language barrier affecting health care; Supervision of other normal pregnancy, antepartum; History of preterm delivery; and Type 2 diabetes mellitus (HRhodes on their problem list.  Patient reports no complaints.  Contractions: Not present. Vag. Bleeding: None.  Movement: Absent. Denies leaking of fluid.   The following portions of the patient's history were reviewed and updated as appropriate: allergies, current medications, past family history, past medical history, past social history, past surgical history and problem list.   Objective:   Vitals:   12/16/19 0922  BP: 101/68  Pulse: 94  Weight: 109 lb (49.4 kg)    Fetal Status: Fetal Heart Rate (bpm): 155 Fundal Height: 13 cm Movement: Absent     General:  Alert, oriented and cooperative. Patient is in no acute distress.  Skin: Skin is warm and dry. No rash noted.   Cardiovascular: Normal heart rate noted  Respiratory: Normal respiratory effort, no problems with respiration noted  Abdomen: Soft, gravid, appropriate for gestational age.  Pain/Pressure: Present     Pelvic: Cervical exam deferred        Extremities: Normal range of motion.  Edema: None  Mental Status: Normal mood and affect. Normal behavior. Normal judgment and thought content.   Assessment and Plan:  Pregnancy: [id number] 139w3d. Gestational diabetes mellitus (GDM), antepartum, gestational diabetes method of control unspecified      States had PP GTT and was told it was normal      I think since she had an elevated glucose at 9 weeks, she is probably Type 2DM  - Ambulatory referral to Nutrition and Diabetic Education - Blood Glucose Monitoring Suppl (ACCU-CHEK GUIDE) w/Device KIT; DX: O24.419 check BS QID.  Dispense: 1 kit; Refill: 0 - Accu-Chek Softclix  Lancets lancets; DX: O24.419 check BS QID.  Dispense: 100 each; Refill: 12 - glucose blood test strip; DX: O24.419 check BS QID.  Dispense: 100 each; Refill: 12 - US[place]FM OB DETAIL +14 WK; Future  2. Supervision of other high risk pregnancy, antepartum, unspecified trimester      Declines Panorama      Plan [place] 5-6 weeks  3. Type 2 diabetes mellitus without complication, without long-term current use of insulin (HCC)   Preterm labor symptoms and general obstetric precautions including but not limited to vaginal bleeding, contractions, leaking of fluid and fetal movement were reviewed in detail with the patient. Please refer to After Visit Summary for other counseling recommendations.   [facility]around [facility]20[facility]2020[facility] Future [facility]ointments  Date Time Provider [place]6/15/2021  3:[facility]  1:15 PM HaLavonia DraftsMD [facility] None    MaHansel FeinsteinCNM

## 2019-12-16 NOTE — Progress Notes (Signed)
[facility] language interpreter N[employer]un [phone]

## 2019-12-16 NOTE — Patient Instructions (Signed)
Gestational Diabetes Mellitus, Diagnosis Gestational diabetes (gestational diabetes mellitus) is a short-term (temporary) form of diabetes that can happen during pregnancy. It goes away after you give birth. It may be caused by one or both of these problems:  Your pancreas does not make enough of a hormone called insulin.  Your body does not respond in a normal way to insulin that it makes. Insulin lets sugars (glucose) go into cells in the body. This gives you energy. If you have diabetes, sugars cannot get into cells. This causes high blood sugar (hyperglycemia). If you get gestational diabetes, you are:  More likely to get it if you get pregnant again.  More likely to develop type 2 diabetes in the future. If gestational diabetes is treated, it may not hurt you or your baby. Your doctor will set treatment goals for you. In general, you should have these blood sugar levels:  After not eating for a long time (fasting): 95 mg/dL (5.3 mmol/L).  After meals (postprandial): ? One hour after a meal: at or below 140 mg/dL (7.8 mmol/L). ? Two hours after a meal: at or below 120 mg/dL (6.7 mmol/L).  A1c (hemoglobin A1c) level: 6-6.5%. Follow these instructions at home: Questions to ask your doctor   You may want to ask these questions: ? Do I need to meet with a diabetes educator? ? What equipment will I need to care for myself at home? ? What medicines do I need? When should I take them? ? How often do I need to check my blood sugar? ? What number can I call if I have questions? ? When is my next doctor's visit? General instructions  Take over-the-counter and prescription medicines only as told by your doctor.  Stay at a healthy weight during pregnancy.  Keep all follow-up visits as told by your doctor. This is important. Contact a doctor if:  Your blood sugar is at or above 240 mg/dL (13.3 mmol/L).  Your blood sugar is at or above 200 mg/dL (11.1 mmol/L) and you have ketones in  your pee (urine).  You have been sick or have had a fever for 2 days or more and you are not getting better.  You have any of these problems for more than 6 hours: ? You cannot eat or drink. ? You feel sick to your stomach (nauseous). ? You throw up (vomit). ? You have watery poop (diarrhea). Get help right away if:  Your blood sugar is lower than 54 mg/dL (3 mmol/L).  You get confused.  You have trouble: ? Thinking clearly. ? Breathing.  Your baby moves less than normal.  You have any of these: ? Moderate or large ketone levels in your pee. ? Blood coming from your vagina. ? Unusual fluid coming from your vagina. ? Early contractions. These may feel like tightness in your belly. Summary  Gestational diabetes is a short-term form of diabetes. It can happen while you are pregnant. It goes away after you give birth.  If gestational diabetes is treated, it may not hurt you or your baby. Your doctor will set treatment goals for you.  Keep all follow-up visits as told by your doctor. This is important. This information is not intended to replace advice given to you by your health care provider. Make sure you discuss any questions you have with your health care provider. Document Revised: 07/26/2017 Document Reviewed: 07/23/2015 Elsevier Patient Education  2020 Elsevier Inc.  

## 2019-12-18 ENCOUNTER — Other Ambulatory Visit: Payer: Self-pay

## 2019-12-23 ENCOUNTER — Other Ambulatory Visit: Payer: Self-pay

## 2019-12-23 MED ORDER — PRENATAL MULTIVITAMIN CH: 1.0000 | ORAL_TABLET | Freq: Every day | ORAL | 10 refills | Status: DC

## 2019-12-23 NOTE — Progress Notes (Unsigned)
Patient called requesting refill on prenatal vitamins. Armandina Stammer RN

## 2019-12-25 ENCOUNTER — Other Ambulatory Visit: Payer: Medicaid Other

## 2020-01-22 ENCOUNTER — Encounter: Payer: Self-pay | Admitting: Obstetrics & Gynecology

## 2020-01-22 ENCOUNTER — Ambulatory Visit (INDEPENDENT_AMBULATORY_CARE_PROVIDER_SITE_OTHER): Payer: Medicaid Other | Admitting: Obstetrics & Gynecology

## 2020-01-22 ENCOUNTER — Other Ambulatory Visit: Payer: Self-pay

## 2020-01-22 VITALS — BP 115/68 | HR 111 | Wt 110.0 lb

## 2020-01-22 DIAGNOSIS — O24419 Gestational diabetes mellitus in pregnancy, unspecified control: Secondary | ICD-10-CM

## 2020-01-22 DIAGNOSIS — O99810 Abnormal glucose complicating pregnancy: Secondary | ICD-10-CM

## 2020-01-22 DIAGNOSIS — O09892 Supervision of other high risk pregnancies, second trimester: Secondary | ICD-10-CM

## 2020-01-22 DIAGNOSIS — Z603 Acculturation difficulty: Secondary | ICD-10-CM

## 2020-01-22 DIAGNOSIS — Z3480 Encounter for supervision of other normal pregnancy, unspecified trimester: Secondary | ICD-10-CM

## 2020-01-22 DIAGNOSIS — O24112 Pre-existing diabetes mellitus, type 2, in pregnancy, second trimester: Secondary | ICD-10-CM

## 2020-01-22 DIAGNOSIS — O09899 Supervision of other high risk pregnancies, unspecified trimester: Secondary | ICD-10-CM

## 2020-01-22 DIAGNOSIS — Z3A18 18 weeks gestation of pregnancy: Secondary | ICD-10-CM

## 2020-01-22 DIAGNOSIS — E119 Type 2 diabetes mellitus without complications: Secondary | ICD-10-CM

## 2020-01-22 DIAGNOSIS — Z789 Other specified health status: Secondary | ICD-10-CM

## 2020-01-22 DIAGNOSIS — Z8751 Personal history of pre-term labor: Secondary | ICD-10-CM

## 2020-01-22 MED ORDER — GLUCOSE BLOOD VI STRP: ORAL_STRIP | 12 refills | Status: DC

## 2020-01-22 NOTE — Progress Notes (Signed)
   PRENATAL VISIT NOTE  Subjective:  Kathleen Henson is a 24 y.o. [id number] at [redacted]w[redacted]d being seen today for ongoing prenatal care.  She is currently monitored for the following issues for this high-risk pregnancy and has Language barrier affecting health care; Supervision of other normal pregnancy, antepartum; History of preterm delivery; Type 2 diabetes mellitus (HCC); and Abnormal glucose tolerance test (GTT) during pregnancy, antepartum on their problem list.  Patient reports no complaints.  Contractions: Not present. Vag. Bleeding: None.  Movement: Present. Denies leaking of fluid.   The following portions of the patient's history were reviewed and updated as appropriate: allergies, current medications, past family history, past medical history, past social history, past surgical history and problem list.   Objective:   Vitals:   01/22/20 1315  BP: 115/68  Pulse: (!) 111  Weight: 110 lb (49.9 kg)    Fetal Status: Fetal Heart Rate (bpm): 155   Movement: Present     General:  Alert, oriented and cooperative. Patient is in no acute distress.  Skin: Skin is warm and dry. No rash noted.   Cardiovascular: Normal heart rate noted  Respiratory: Normal respiratory effort, no problems with respiration noted  Abdomen: Soft, gravid, appropriate for gestational age.  Pain/Pressure: Present     Pelvic: Cervical exam deferred        Extremities: Normal range of motion.  Edema: None  Mental Status: Normal mood and affect. Normal behavior. Normal judgment and thought content.   Assessment and Plan:  Pregnancy: [id number] at [redacted]w[redacted]d 1. Gestational diabetes mellitus (GDM), antepartum, gestational diabetes method of control unspecified The fasting and dinner glucose ranges are WNL. The afternoon levels are all elevated. I have discussed with her the am meal which is white rice. I have discussed the resulting increased glc and she is willing to alter her diet. She would like to try this.   - glucose blood test  strip; DX: O24.419 check BS QID.  Dispense: 100 each; Refill: 12  2. Supervision of other high risk pregnancy, antepartum, unspecified trimester  - glucose blood test strip; DX: O24.419 check BS QID.  Dispense: 100 each; Refill: 12  3. Supervision of other normal pregnancy, antepartum  4. History of preterm delivery Reviewed PTL and counseled on 17-OH-P. Pt declined.   5. Language barrier affecting health care [place] interpreter used for entire visit.  Preterm labor symptoms and general obstetric precautions including but not limited to vaginal bleeding, contractions, leaking of fluid and fetal movement were reviewed in detail with the patient. Please refer to After Visit Summary for other counseling recommendations.   No follow-ups on file.  Future Appointments  Date Time Provider Department Center  01/22/2020  1:30 PM Willodean Rosenthal, MD [facility] None  01/27/2020  1:30 PM [facility] NURSE [facility]  01/27/2020  1:30 PM [facility] US3 [facility]    Willodean Rosenthal, MD

## 2020-01-22 NOTE — Progress Notes (Signed)
Video interpreting: Baird Lyons [id number]. Armandina Stammer RN

## 2020-01-24 LAB — AFP TETRA
DIA Mom Value: 0.82
DIA Value (EIA): 159.29 pg/mL
DSR (By Age)    1 IN: 1124
DSR (Second Trimester) 1 IN: 7830
Gestational Age: 18.5 WEEKS
MSAFP Mom: 0.84
MSAFP: 49.4 ng/mL
MSHCG Mom: 0.8
MSHCG: 26686 m[IU]/mL
Maternal Age At EDD: 22.2 yr
Osb Risk: 10000
T18 (By Age): 1:4377 {titer}
Test Results:: NEGATIVE
Weight: 110 [lb_av]
uE3 Mom: 0.84
uE3 Value: 1.51 ng/mL

## 2020-01-27 ENCOUNTER — Encounter: Payer: Self-pay | Admitting: *Deleted

## 2020-01-27 ENCOUNTER — Ambulatory Visit: Payer: Medicaid Other | Admitting: *Deleted

## 2020-01-27 ENCOUNTER — Other Ambulatory Visit: Payer: Self-pay

## 2020-01-27 ENCOUNTER — Ambulatory Visit: Payer: Medicaid Other | Attending: Obstetrics and Gynecology

## 2020-01-27 ENCOUNTER — Other Ambulatory Visit: Payer: Self-pay | Admitting: *Deleted

## 2020-01-27 DIAGNOSIS — O24419 Gestational diabetes mellitus in pregnancy, unspecified control: Secondary | ICD-10-CM | POA: Insufficient documentation

## 2020-01-27 DIAGNOSIS — O358XX0 Maternal care for other (suspected) fetal abnormality and damage, not applicable or unspecified: Secondary | ICD-10-CM | POA: Diagnosis not present

## 2020-01-27 DIAGNOSIS — O24410 Gestational diabetes mellitus in pregnancy, diet controlled: Secondary | ICD-10-CM | POA: Diagnosis not present

## 2020-01-27 DIAGNOSIS — O09213 Supervision of pregnancy with history of pre-term labor, third trimester: Secondary | ICD-10-CM | POA: Diagnosis not present

## 2020-01-27 DIAGNOSIS — Z3480 Encounter for supervision of other normal pregnancy, unspecified trimester: Secondary | ICD-10-CM

## 2020-01-27 DIAGNOSIS — Z363 Encounter for antenatal screening for malformations: Secondary | ICD-10-CM | POA: Diagnosis not present

## 2020-01-27 DIAGNOSIS — O09899 Supervision of other high risk pregnancies, unspecified trimester: Secondary | ICD-10-CM | POA: Diagnosis present

## 2020-01-27 DIAGNOSIS — Z3A19 19 weeks gestation of pregnancy: Secondary | ICD-10-CM | POA: Diagnosis not present

## 2020-01-27 DIAGNOSIS — O09892 Supervision of other high risk pregnancies, second trimester: Secondary | ICD-10-CM | POA: Insufficient documentation

## 2020-02-01 ENCOUNTER — Encounter (HOSPITAL_COMMUNITY): Payer: Self-pay | Admitting: Obstetrics & Gynecology

## 2020-02-01 ENCOUNTER — Emergency Department (HOSPITAL_COMMUNITY)
Admission: AD | Admit: 2020-02-01 | Discharge: 2020-02-01 | Disposition: A | Discharge status: Home / Self Care | Payer: Medicaid Other | Attending: Obstetrics & Gynecology | Admitting: Obstetrics & Gynecology

## 2020-02-01 ENCOUNTER — Emergency Department (HOSPITAL_COMMUNITY): Payer: Medicaid Other

## 2020-02-01 DIAGNOSIS — R1013 Epigastric pain: Secondary | ICD-10-CM | POA: Diagnosis not present

## 2020-02-01 DIAGNOSIS — Z3480 Encounter for supervision of other normal pregnancy, unspecified trimester: Secondary | ICD-10-CM

## 2020-02-01 DIAGNOSIS — K219 Gastro-esophageal reflux disease without esophagitis: Secondary | ICD-10-CM | POA: Insufficient documentation

## 2020-02-01 DIAGNOSIS — O26899 Other specified pregnancy related conditions, unspecified trimester: Secondary | ICD-10-CM

## 2020-02-01 DIAGNOSIS — R109 Unspecified abdominal pain: Secondary | ICD-10-CM | POA: Diagnosis present

## 2020-02-01 DIAGNOSIS — O219 Vomiting of pregnancy, unspecified: Secondary | ICD-10-CM | POA: Diagnosis not present

## 2020-02-01 DIAGNOSIS — O99892 Other specified diseases and conditions complicating childbirth: Secondary | ICD-10-CM | POA: Insufficient documentation

## 2020-02-01 DIAGNOSIS — E119 Type 2 diabetes mellitus without complications: Secondary | ICD-10-CM | POA: Insufficient documentation

## 2020-02-01 DIAGNOSIS — Z3A20 20 weeks gestation of pregnancy: Secondary | ICD-10-CM | POA: Insufficient documentation

## 2020-02-01 LAB — COMPREHENSIVE METABOLIC PANEL
ALT: 17 U/L (ref 0–44)
AST: 16 U/L (ref 15–41)
Albumin: 3.3 g/dL — ABNORMAL LOW (ref 3.5–5.0)
Alkaline Phosphatase: 52 U/L (ref 38–126)
Anion gap: 9 (ref 5–15)
BUN: 5 mg/dL — ABNORMAL LOW (ref 6–20)
CO2: 23 mmol/L (ref 22–32)
Calcium: 8.8 mg/dL — ABNORMAL LOW (ref 8.9–10.3)
Chloride: 104 mmol/L (ref 98–111)
Creatinine, Ser: 0.48 mg/dL (ref 0.44–1.00)
GFR calc Af Amer: >60 mL/min (ref >60)
GFR calc non Af Amer: >60 mL/min (ref >60)
Glucose, Bld: 89 mg/dL (ref 70–99)
Potassium: 4 mmol/L (ref 3.5–5.1)
Sodium: 136 mmol/L (ref 135–145)
Total Bilirubin: 0.6 mg/dL (ref 0.3–1.2)
Total Protein: 6.6 g/dL (ref 6.5–8.1)

## 2020-02-01 LAB — URINALYSIS, ROUTINE W REFLEX MICROSCOPIC
Bacteria, UA: NONE SEEN
Bilirubin Urine: NEGATIVE
Glucose, UA: NEGATIVE mg/dL
Hgb urine dipstick: NEGATIVE
Ketones, ur: 5 mg/dL — AB
Nitrite: NEGATIVE
Protein, ur: NEGATIVE mg/dL
Specific Gravity, Urine: 1.015 (ref 1.005–1.030)
pH: 7 (ref 5.0–8.0)

## 2020-02-01 LAB — LIPASE, BLOOD: Lipase: 22 U/L (ref 11–51)

## 2020-02-01 MED ORDER — ACETAMINOPHEN 500 MG PO TABS
1000.0000 mg | ORAL_TABLET | ORAL | Status: AC
  Administered 2020-02-01: 1000 mg via ORAL
  Filled 2020-02-01: qty 2

## 2020-02-01 MED ORDER — OMEPRAZOLE 40 MG PO CPDR: 40.0000 mg | DELAYED_RELEASE_CAPSULE | Freq: Every day | ORAL | 0 refills | Status: DC

## 2020-02-01 MED ORDER — LIDOCAINE VISCOUS HCL 2 % MT SOLN
15.0000 mL | Freq: Once | OROMUCOSAL | Status: AC
  Administered 2020-02-01: 15 mL via ORAL
  Filled 2020-02-01: qty 15

## 2020-02-01 MED ORDER — ALUM & MAG HYDROXIDE-SIMETH 200-200-20 MG/5ML PO SUSP
30.0000 mL | Freq: Once | ORAL | Status: AC
  Administered 2020-02-01: 30 mL via ORAL
  Filled 2020-02-01: qty 30

## 2020-02-01 NOTE — ED Provider Notes (Signed)
24 year old G3P2 female presents with severe upper abdominal pain for the past 2 days. She speaks Clydie Braun and an interpreter was utilized. It is primarily upper abdominal pain. She has never had this before. There has been no bleeding or leakage of fluids. Her OBGYN is Chiropractor.  Discussed with Denny Peon who accepts pt in transfer.   MSE was initiated and I personally evaluated the patient and placed orders (if any) at  2:24 PM on February 01, 2020.  The patient appears stable so that the remainder of the MSE may be completed by another provider.   Bethel Born, PA-C 02/01/20 1425    Tegeler, Canary Brim, MD 02/01/20 [phone]

## 2020-02-01 NOTE — MAU Note (Signed)
Pt is a G4P2 c/o abdominal pain with burning in the chest that started on Thursday that is worse now with burning pain.  Pt states it is constant and is in her entire abdomen and back.  Pt states it is worse with eating and is associated with nausea and vomiting.  Pt states she feels bloated and gassy.  Pt has feeling of needing to have BM but is unable to go. Last BM yesterday.  Pt reports urinating normally.    Pt reports positive fetal movement and no bleeding.

## 2020-02-01 NOTE — ED Notes (Signed)
Pt seen by PA on ED while on triage.

## 2020-02-01 NOTE — MAU Provider Note (Signed)
History     CSN: [id number]  Arrival date and time: 02/01/20 1440   None     Chief Complaint  Patient presents with  . Abdominal Pain   HPI  Patient is a [id number] at 71w1dwho presents for epigastric pain. Interpreter used for HPI. Pregnancy complicated by DMII, not on medications. Patient reports acute onset epigastric pain on Thursday. Pain has been persistent since Thursday and worsens with eating. She says taking tylenol helps pain a little bit. She has had few episodes of nausea/vomiting, but has been able to keep some food/fluids down. Pain is in epigastric region and is described as burning. She has never had this pain before. Endorses bloating and gassiness. No difficulty swallowing. No fevers or chills. Has not taken any other meds. Denies feeling cramping or contractions. She is feeling baby movements. No vaginal bleeding or leakage of fluid.    Hx of 2 vaginal deliveries at [id number] Past Medical History:  Diagnosis Date  . Diabetes mellitus without complication (HCCCerulean  Type 2    Past Surgical History:  Procedure Laterality Date  . NO PAST SURGERIES      Family History  Problem Relation Age of Onset  . Cancer Neg Hx   . Hypertension Neg Hx   . Diabetes Neg Hx     Social History   Tobacco Use  . Smoking status: Never Smoker  . Smokeless tobacco: Never Used  Vaping Use  . Vaping Use: Never used  Substance Use Topics  . Alcohol use: No  . Drug use: No    Allergies: No Known Allergies  Medications Prior to Admission  Medication Sig Dispense Refill Last Dose  . Accu-Chek Softclix Lancets lancets DX: O24.419 check BS QID. 100 each 12   . acetaminophen (TYLENOL) 325 MG tablet Take 650 mg by mouth every 6 (six) hours as needed for moderate pain.     . Blood Glucose Monitoring Suppl (ACCU-CHEK GUIDE) w/Device KIT DX: O24.419 check BS QID. 1 kit 0   . Doxylamine-Pyridoxine (DICLEGIS) 10-10 MG TBEC Take 2 tablets by mouth at bedtime as needed (nausea).  (Patient not taking: Reported on 12/16/2019) 60 tablet 2   . glucose blood test strip DX: O24.419 check BS QID. 100 each 12   . ibuprofen (ADVIL,MOTRIN) 600 MG tablet Take 1 tablet (600 mg total) by mouth every 6 (six) hours as needed. (Patient not taking: Reported on 09/16/2019) 30 tablet 0   . Prenatal Vit-Fe Fumarate-FA (PRENATAL MULTIVITAMIN) TABS tablet Take 1 tablet by mouth daily at 12 noon. 30 tablet 10     Review of Systems  Constitutional: Negative for activity change and appetite change.  Respiratory: Negative for chest tightness.   Cardiovascular: Negative for chest pain.  Gastrointestinal: Positive for abdominal pain, nausea and vomiting. Negative for abdominal distention, blood in stool and diarrhea.  Genitourinary: Negative for dysuria and flank pain.  Neurological: Negative for dizziness and light-headedness.   Physical Exam   Blood pressure (!) 106/63, pulse 86, temperature 98.2 F (36.8 C), temperature source Oral, resp. rate 18, last menstrual period 09/13/2019, SpO2 100 %, unknown if currently breastfeeding.  Physical Exam Vitals and nursing note reviewed.  Constitutional:      Appearance: She is well-developed.  HENT:     Head: Normocephalic and atraumatic.  Abdominal:     General: Bowel sounds are normal.     Tenderness: There is abdominal tenderness in the epigastric area. There is no guarding or rebound. Negative  signs include Murphy's sign.  Skin:    General: Skin is warm and dry.  Neurological:     Mental Status: She is alert.  Psychiatric:        Mood and Affect: Mood normal.        Behavior: Behavior normal.     MAU Course  Procedures  MDM -patient HPI obtained with interpreter -CMP, lipase  wnl -GI cocktail administered -1g tylenol administered - RUQ [place] negative  - patient given PO challenge. Patient reports improvement in pain.   Assessment and Plan   Epigastric pain - likely 2/2 GERD -improved w GI cocktail -discharged with  prescription for omeprazole 40 mg daily. Discussed lifestyle modifications to reduce GERD symptoms.  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

## 2020-02-06 ENCOUNTER — Encounter: Payer: Medicaid Other | Admitting: Family Medicine

## 2020-02-10 ENCOUNTER — Ambulatory Visit (INDEPENDENT_AMBULATORY_CARE_PROVIDER_SITE_OTHER): Payer: Medicaid Other | Admitting: Obstetrics and Gynecology

## 2020-02-10 ENCOUNTER — Other Ambulatory Visit: Payer: Self-pay

## 2020-02-10 VITALS — BP 103/66 | HR 100 | Wt 111.0 lb

## 2020-02-10 DIAGNOSIS — O24410 Gestational diabetes mellitus in pregnancy, diet controlled: Secondary | ICD-10-CM

## 2020-02-10 DIAGNOSIS — E119 Type 2 diabetes mellitus without complications: Secondary | ICD-10-CM

## 2020-02-10 DIAGNOSIS — O24419 Gestational diabetes mellitus in pregnancy, unspecified control: Secondary | ICD-10-CM

## 2020-02-10 DIAGNOSIS — O09892 Supervision of other high risk pregnancies, second trimester: Secondary | ICD-10-CM

## 2020-02-10 DIAGNOSIS — O09899 Supervision of other high risk pregnancies, unspecified trimester: Secondary | ICD-10-CM

## 2020-02-10 DIAGNOSIS — Z3A21 21 weeks gestation of pregnancy: Secondary | ICD-10-CM

## 2020-02-10 LAB — POCT URINALYSIS DIPSTICK OB: POC,PROTEIN,UA: NEGATIVE

## 2020-02-10 MED ORDER — ACCU-CHEK SOFTCLIX LANCETS MISC: 12 refills | Status: DC

## 2020-02-10 MED ORDER — METFORMIN HCL 500 MG PO TABS: 500.0000 mg | ORAL_TABLET | Freq: Two times a day (BID) | ORAL | 1 refills | Status: DC

## 2020-02-10 MED ORDER — GLUCOSE BLOOD VI STRP: ORAL_STRIP | 12 refills | Status: DC

## 2020-02-10 NOTE — Progress Notes (Signed)
Patient has glucose record. Armandina Stammer RN   Interpreter [phone] used for visit. Armandina Stammer RN

## 2020-02-10 NOTE — Addendum Note (Signed)
Addended by: Anell Barr on: 02/10/2020 10:12 AM   Modules accepted: Orders

## 2020-02-10 NOTE — Progress Notes (Signed)
   PRENATAL VISIT NOTE  Subjective:  Kathleen Henson is a 24 y.o. O1H0865 at [redacted]w[redacted]d being seen today for ongoing prenatal care.  She is currently monitored for the following issues for this high-risk pregnancy and has Language barrier affecting health care; Supervision of other normal pregnancy, antepartum; History of preterm delivery; Type 2 diabetes mellitus (HCC); and Abnormal glucose tolerance test (GTT) during pregnancy, antepartum on their problem list.  Patient reports no complaints.  Contractions: Not present. Vag. Bleeding: None.  Movement: Present. Denies leaking of fluid.   The following portions of the patient's history were reviewed and updated as appropriate: allergies, current medications, past family history, past medical history, past social history, past surgical history and problem list.   Objective:   Vitals:   02/10/20 0935  BP: 103/66  Pulse: 100  Weight: 111 lb (50.3 kg)    Fetal Status: Fetal Heart Rate (bpm): 148   Movement: Present     General:  Alert, oriented and cooperative. Patient is in no acute distress.  Skin: Skin is warm and dry. No rash noted.   Cardiovascular: Normal heart rate noted  Respiratory: Normal respiratory effort, no problems with respiration noted  Abdomen: Soft, gravid, appropriate for gestational age.  Pain/Pressure: Absent     Pelvic: Cervical exam deferred        Extremities: Normal range of motion.  Edema: None  Mental Status: Normal mood and affect. Normal behavior. Normal judgment and thought content.   Assessment and Plan:  Pregnancy: [id number] at [redacted]w[redacted]d  1. [redacted] weeks gestation of pregnancy  Doing well.   2. Type 2 diabetes mellitus without complication, without long-term current use of insulin (HCC)  1 hour glucola @ 9 weeks = 214 50 % of BS elevated at lunch and dinner.  Metformin 500 mg BID PO - discussed with Dr. Donavan Foil. Return in 2 weeks with log for BS check.  Start antenatal testing with MFM, growth [place] with MFM starting at  24 weeks.   Preterm labor symptoms and general obstetric precautions including but not limited to vaginal bleeding, contractions, leaking of fluid and fetal movement were reviewed in detail with the patient. Please refer to After Visit Summary for other counseling recommendations.   Return in about 2 weeks (around 02/24/2020) for MD only, no app schedule, bring back for BS log check .  Future Appointments  Date Time Provider Department Center  02/24/2020  2:15 PM [facility] NURSE [facility]  02/24/2020  2:30 PM [facility] US3 [facility]    Venia Carbon, NP

## 2020-02-10 NOTE — Addendum Note (Signed)
Addended by: Anell Barr on: 02/10/2020 10:11 AM   Modules accepted: Orders

## 2020-02-10 NOTE — Addendum Note (Signed)
Addended by: Venia Carbon I on: 02/10/2020 10:33 AM   Modules accepted: Orders

## 2020-02-24 ENCOUNTER — Ambulatory Visit: Payer: Medicaid Other | Admitting: *Deleted

## 2020-02-24 ENCOUNTER — Ambulatory Visit: Payer: Medicaid Other | Attending: Obstetrics and Gynecology

## 2020-02-24 ENCOUNTER — Encounter: Payer: Self-pay | Admitting: *Deleted

## 2020-02-24 ENCOUNTER — Other Ambulatory Visit: Payer: Self-pay

## 2020-02-24 DIAGNOSIS — O24410 Gestational diabetes mellitus in pregnancy, diet controlled: Secondary | ICD-10-CM

## 2020-02-24 DIAGNOSIS — O321XX0 Maternal care for breech presentation, not applicable or unspecified: Secondary | ICD-10-CM | POA: Diagnosis not present

## 2020-02-24 DIAGNOSIS — Z3480 Encounter for supervision of other normal pregnancy, unspecified trimester: Secondary | ICD-10-CM | POA: Insufficient documentation

## 2020-02-24 DIAGNOSIS — Z3A23 23 weeks gestation of pregnancy: Secondary | ICD-10-CM

## 2020-02-24 DIAGNOSIS — O358XX0 Maternal care for other (suspected) fetal abnormality and damage, not applicable or unspecified: Secondary | ICD-10-CM

## 2020-02-24 DIAGNOSIS — O09212 Supervision of pregnancy with history of pre-term labor, second trimester: Secondary | ICD-10-CM | POA: Diagnosis not present

## 2020-02-24 DIAGNOSIS — O24415 Gestational diabetes mellitus in pregnancy, controlled by oral hypoglycemic drugs: Secondary | ICD-10-CM

## 2020-02-24 DIAGNOSIS — Z362 Encounter for other antenatal screening follow-up: Secondary | ICD-10-CM

## 2020-02-26 ENCOUNTER — Ambulatory Visit (INDEPENDENT_AMBULATORY_CARE_PROVIDER_SITE_OTHER): Payer: Medicaid Other | Admitting: Family Medicine

## 2020-02-26 ENCOUNTER — Other Ambulatory Visit: Payer: Self-pay

## 2020-02-26 VITALS — BP 106/57 | HR 66 | Wt 115.0 lb

## 2020-02-26 DIAGNOSIS — E119 Type 2 diabetes mellitus without complications: Secondary | ICD-10-CM

## 2020-02-26 DIAGNOSIS — Z8751 Personal history of pre-term labor: Secondary | ICD-10-CM

## 2020-02-26 DIAGNOSIS — Z3A23 23 weeks gestation of pregnancy: Secondary | ICD-10-CM

## 2020-02-26 DIAGNOSIS — O09899 Supervision of other high risk pregnancies, unspecified trimester: Secondary | ICD-10-CM

## 2020-02-26 DIAGNOSIS — Z789 Other specified health status: Secondary | ICD-10-CM

## 2020-02-26 NOTE — Progress Notes (Signed)
Subjective:  Kathleen Henson is a 24 y.o. [id number] at [redacted]w[redacted]d being seen today for ongoing prenatal care.  She is currently monitored for the following issues for this high-risk pregnancy and has Language barrier affecting health care; Supervision of other normal pregnancy, antepartum; History of preterm delivery; Type 2 diabetes mellitus (HCC); and Abnormal glucose tolerance test (GTT) during pregnancy, antepartum on their problem list.  GDM: Patient taking metformin.  Reports no hypoglycemic episodes.  Tolerating medication well Fasting: controlled 2hr PP: controlled  Patient reports no complaints.  Contractions: Not present. Vag. Bleeding: None.  Movement: Present. Denies leaking of fluid.   The following portions of the patient's history were reviewed and updated as appropriate: allergies, current medications, past family history, past medical history, past social history, past surgical history and problem list. Problem list updated.  Objective:   Vitals:   02/26/20 1344  BP: (!) 106/57  Pulse: 66  Weight: 115 lb (52.2 kg)    Fetal Status: Fetal Heart Rate (bpm): 151 Fundal Height: 26 cm Movement: Present     General:  Alert, oriented and cooperative. Patient is in no acute distress.  Skin: Skin is warm and dry. No rash noted.   Cardiovascular: Normal heart rate noted  Respiratory: Normal respiratory effort, no problems with respiration noted  Abdomen: Soft, gravid, appropriate for gestational age. Pain/Pressure: Present     Pelvic: Vag. Bleeding: None     Cervical exam deferred        Extremities: Normal range of motion.  Edema: None  Mental Status: Normal mood and affect. Normal behavior. Normal judgment and thought content.   Urinalysis:      Assessment and Plan:  Pregnancy: E2V3612 at [redacted]w[redacted]d  1. [redacted] weeks gestation of pregnancy 2. Supervision of other high risk pregnancy, antepartum, unspecified trimester FHT and FH normal  3. Type 2 diabetes mellitus without complication,  without long-term current use of insulin (HCC) Continue metformin Growth [place] Antenatal testing at 32 weeks  4. Language barrier affecting health care Interpreter used.  5. History of preterm delivery  Preterm labor symptoms and general obstetric precautions including but not limited to vaginal bleeding, contractions, leaking of fluid and fetal movement were reviewed in detail with the patient. Please refer to After Visit Summary for other counseling recommendations.  No follow-ups on file.   Levie Heritage, DO

## 2020-02-26 NOTE — Progress Notes (Signed)
[employer] language services interpreter Naw [phone].

## 2020-03-02 ENCOUNTER — Ambulatory Visit: Payer: Medicaid Other

## 2020-03-23 ENCOUNTER — Ambulatory Visit (INDEPENDENT_AMBULATORY_CARE_PROVIDER_SITE_OTHER): Payer: Medicaid Other | Admitting: Advanced Practice Midwife

## 2020-03-23 ENCOUNTER — Encounter: Payer: Self-pay | Admitting: Advanced Practice Midwife

## 2020-03-23 ENCOUNTER — Other Ambulatory Visit: Payer: Self-pay

## 2020-03-23 VITALS — BP 107/57 | HR 97 | Wt 121.0 lb

## 2020-03-23 DIAGNOSIS — Z3402 Encounter for supervision of normal first pregnancy, second trimester: Secondary | ICD-10-CM

## 2020-03-23 DIAGNOSIS — Z3A27 27 weeks gestation of pregnancy: Secondary | ICD-10-CM

## 2020-03-23 DIAGNOSIS — E119 Type 2 diabetes mellitus without complications: Secondary | ICD-10-CM

## 2020-03-23 DIAGNOSIS — Z23 Encounter for immunization: Secondary | ICD-10-CM

## 2020-03-23 NOTE — Progress Notes (Signed)
Refill on Metformin

## 2020-03-23 NOTE — Patient Instructions (Signed)

## 2020-03-23 NOTE — Progress Notes (Signed)
° °  PRENATAL VISIT NOTE  Subjective:  Kathleen Henson is a 24 y.o. W4R1540 at [redacted]w[redacted]d being seen today for ongoing prenatal care.  She is currently monitored for the following issues for this high-risk pregnancy and has Language barrier affecting health care; Supervision of other normal pregnancy, antepartum; History of preterm delivery; Type 2 diabetes mellitus (HCC); and Abnormal glucose tolerance test (GTT) during pregnancy, antepartum on their problem list.  Patient reports no complaints.  Contractions: Not present.  .  Movement: Present. Denies leaking of fluid.   The following portions of the patient's history were reviewed and updated as appropriate: allergies, current medications, past family history, past medical history, past social history, past surgical history and problem list.   Objective:   Vitals:   03/23/20 0852  BP: (!) 107/57  Pulse: 97  Weight: 121 lb (54.9 kg)    Fetal Status: Fetal Heart Rate (bpm): 144 Fundal Height: 31 cm Movement: Present     General:  Alert, oriented and cooperative. Patient is in no acute distress.  Skin: Skin is warm and dry. No rash noted.   Cardiovascular: Normal heart rate noted  Respiratory: Normal respiratory effort, no problems with respiration noted  Abdomen: Soft, gravid, appropriate for gestational age.  Pain/Pressure: Present     Pelvic: Cervical exam deferred        Extremities: Normal range of motion.  Edema: None  Mental Status: Normal mood and affect. Normal behavior. Normal judgment and thought content.   Assessment and Plan:  Pregnancy: [id number] at [redacted]w[redacted]d 1. Encounter for supervision of normal first pregnancy in second trimester     Add SMA and Panorama - RPR - CBC - HIV Antibody (routine testing w rflx) - Genetic Screening  2. Type 2 diabetes mellitus without complication, without long-term current use of insulin (HCC)  Refilled Rx for Metformin  Has [place] scheduled for next week, ?LGA  3. [redacted] weeks gestation of  pregnancy   Preterm labor symptoms and general obstetric precautions including but not limited to vaginal bleeding, contractions, leaking of fluid and fetal movement were reviewed in detail with the patient. Please refer to After Visit Summary for other counseling recommendations.   Return in about 2 weeks (around 04/06/2020) for [employer].  Future Appointments  Date Time Provider Department Center  03/30/2020  1:00 PM [facility] NURSE [facility]  03/30/2020  1:15 PM [facility] US2 [facility]  04/06/2020  9:10 AM Aviva Signs, CNM [facility] None  04/20/2020  1:30 PM [facility] NURSE [facility]  04/20/2020  1:45 PM [facility] US4 [facility]    Wynelle Bourgeois, CNM

## 2020-03-24 LAB — CBC
Hematocrit: 34.2 % (ref 34.0–46.6)
Hemoglobin: 11.6 g/dL (ref 11.1–15.9)
MCH: 29.6 pg (ref 26.6–33.0)
MCHC: 33.9 g/dL (ref 31.5–35.7)
MCV: 87 fL (ref 79–97)
Platelets: 304 10*3/uL (ref 150–450)
RBC: 3.92 x10E6/uL (ref 3.77–5.28)
RDW: 11.8 % (ref 11.7–15.4)
WBC: 11.1 10*3/uL — ABNORMAL HIGH (ref 3.4–10.8)

## 2020-03-24 LAB — RPR: RPR Ser Ql: NONREACTIVE

## 2020-03-24 LAB — HIV ANTIBODY (ROUTINE TESTING W REFLEX): HIV Screen 4th Generation wRfx: NONREACTIVE

## 2020-03-30 ENCOUNTER — Ambulatory Visit: Payer: Medicaid Other | Attending: Obstetrics and Gynecology

## 2020-03-30 ENCOUNTER — Other Ambulatory Visit: Payer: Self-pay | Admitting: *Deleted

## 2020-03-30 ENCOUNTER — Ambulatory Visit: Payer: Medicaid Other | Admitting: *Deleted

## 2020-03-30 ENCOUNTER — Other Ambulatory Visit: Payer: Self-pay

## 2020-03-30 ENCOUNTER — Encounter: Payer: Self-pay | Admitting: *Deleted

## 2020-03-30 DIAGNOSIS — O24415 Gestational diabetes mellitus in pregnancy, controlled by oral hypoglycemic drugs: Secondary | ICD-10-CM

## 2020-03-30 DIAGNOSIS — Z3480 Encounter for supervision of other normal pregnancy, unspecified trimester: Secondary | ICD-10-CM

## 2020-03-30 DIAGNOSIS — E119 Type 2 diabetes mellitus without complications: Secondary | ICD-10-CM | POA: Diagnosis not present

## 2020-03-30 DIAGNOSIS — O358XX0 Maternal care for other (suspected) fetal abnormality and damage, not applicable or unspecified: Secondary | ICD-10-CM | POA: Diagnosis not present

## 2020-03-30 DIAGNOSIS — Z362 Encounter for other antenatal screening follow-up: Secondary | ICD-10-CM

## 2020-03-30 DIAGNOSIS — O24113 Pre-existing diabetes mellitus, type 2, in pregnancy, third trimester: Secondary | ICD-10-CM

## 2020-03-30 DIAGNOSIS — O09213 Supervision of pregnancy with history of pre-term labor, third trimester: Secondary | ICD-10-CM

## 2020-03-30 DIAGNOSIS — Z3A28 28 weeks gestation of pregnancy: Secondary | ICD-10-CM

## 2020-04-01 ENCOUNTER — Other Ambulatory Visit: Payer: Self-pay

## 2020-04-06 ENCOUNTER — Encounter: Payer: Self-pay | Admitting: Advanced Practice Midwife

## 2020-04-06 ENCOUNTER — Other Ambulatory Visit: Payer: Self-pay

## 2020-04-06 ENCOUNTER — Other Ambulatory Visit: Payer: Self-pay | Admitting: Obstetrics and Gynecology

## 2020-04-06 ENCOUNTER — Ambulatory Visit (INDEPENDENT_AMBULATORY_CARE_PROVIDER_SITE_OTHER): Payer: Medicaid Other | Admitting: Advanced Practice Midwife

## 2020-04-06 VITALS — BP 135/63 | HR 98 | Wt 124.0 lb

## 2020-04-06 DIAGNOSIS — E119 Type 2 diabetes mellitus without complications: Secondary | ICD-10-CM

## 2020-04-06 DIAGNOSIS — O09899 Supervision of other high risk pregnancies, unspecified trimester: Secondary | ICD-10-CM

## 2020-04-06 DIAGNOSIS — Z789 Other specified health status: Secondary | ICD-10-CM

## 2020-04-06 NOTE — Progress Notes (Signed)
Clydie Braun interpreter used for visit. Armandina Stammer RN

## 2020-04-06 NOTE — Patient Instructions (Signed)

## 2020-04-06 NOTE — Progress Notes (Signed)
   PRENATAL VISIT NOTE  Subjective:  Kathleen Henson is a 24 y.o. O8C1660 at [redacted]w[redacted]d being seen today for ongoing prenatal care.  She is currently monitored for the following issues for this high-risk pregnancy and has Language barrier affecting health care; Supervision of other normal pregnancy, antepartum; History of preterm delivery; Type 2 diabetes mellitus (HCC); and Abnormal glucose tolerance test (GTT) during pregnancy, antepartum on their problem list.  Patient reports no complaints.  Contractions: Not present. Vag. Bleeding: None.  Movement: Present. Denies leaking of fluid.   The following portions of the patient's history were reviewed and updated as appropriate: allergies, current medications, past family history, past medical history, past social history, past surgical history and problem list.   Objective:   Vitals:   04/06/20 0922  BP: 135/63  Pulse: 98  Weight: 124 lb (56.2 kg)    Fetal Status: Fetal Heart Rate (bpm): 145 Fundal Height: 30 cm Movement: Present     General:  Alert, oriented and cooperative. Patient is in no acute distress.  Skin: Skin is warm and dry. No rash noted.   Cardiovascular: Normal heart rate noted  Respiratory: Normal respiratory effort, no problems with respiration noted  Abdomen: Soft, gravid, appropriate for gestational age.  Pain/Pressure: Present     Pelvic: Cervical exam deferred        Extremities: Normal range of motion.  Edema: None  Mental Status: Normal mood and affect. Normal behavior. Normal judgment and thought content.   Assessment and Plan:  Pregnancy: Y3K1601 at [redacted]w[redacted]d 1. Type 2 diabetes mellitus without complication, without long-term current use of insulin (HCC)      All Fasting BS are within range.   Most all 2 hr PC values are within range.  Only 2-3 are 123-124.  2. Language barrier affecting health care     Video interpretor used  3. Supervision of other high risk pregnancy, antepartum, unspecified trimester      [place] for  growth are scheduled   Preterm labor symptoms and general obstetric precautions including but not limited to vaginal bleeding, contractions, leaking of fluid and fetal movement were reviewed in detail with the patient. Please refer to After Visit Summary for other counseling recommendations.    Future Appointments  Date Time Provider Department Center  04/20/2020  8:50 AM Aviva Signs, CNM [facility] None  04/20/2020  1:30 PM [facility] NURSE [facility]  04/20/2020  1:45 PM [facility] US4 [facility]  04/28/2020 11:00 AM [facility] NURSE [facility]  04/28/2020 11:15 AM [facility] US2 [facility]  05/04/2020 11:00 AM [facility] NURSE [facility]  05/04/2020 11:15 AM [facility] US2 [facility]    Wynelle Bourgeois, CNM

## 2020-04-20 ENCOUNTER — Ambulatory Visit (INDEPENDENT_AMBULATORY_CARE_PROVIDER_SITE_OTHER): Payer: Medicaid Other | Admitting: Advanced Practice Midwife

## 2020-04-20 ENCOUNTER — Encounter: Payer: Self-pay | Admitting: *Deleted

## 2020-04-20 ENCOUNTER — Ambulatory Visit: Payer: Medicaid Other | Admitting: *Deleted

## 2020-04-20 ENCOUNTER — Encounter: Payer: Self-pay | Admitting: Advanced Practice Midwife

## 2020-04-20 ENCOUNTER — Other Ambulatory Visit: Payer: Self-pay

## 2020-04-20 ENCOUNTER — Ambulatory Visit: Payer: Medicaid Other | Attending: Obstetrics and Gynecology

## 2020-04-20 VITALS — BP 107/69 | HR 102 | Wt 126.0 lb

## 2020-04-20 DIAGNOSIS — Z3480 Encounter for supervision of other normal pregnancy, unspecified trimester: Secondary | ICD-10-CM

## 2020-04-20 DIAGNOSIS — E119 Type 2 diabetes mellitus without complications: Secondary | ICD-10-CM

## 2020-04-20 DIAGNOSIS — Z3A31 31 weeks gestation of pregnancy: Secondary | ICD-10-CM

## 2020-04-20 DIAGNOSIS — Z789 Other specified health status: Secondary | ICD-10-CM

## 2020-04-20 MED ORDER — ASPIRIN 81 MG PO CHEW: 81.0000 mg | CHEWABLE_TABLET | Freq: Every day | ORAL | 4 refills | Status: DC

## 2020-04-20 NOTE — Patient Instructions (Signed)

## 2020-04-20 NOTE — Progress Notes (Signed)
   PRENATAL VISIT NOTE  Subjective:  Kathleen Henson is a 24 y.o. J4H7026 at [redacted]w[redacted]d being seen today for ongoing prenatal care.  She is currently monitored for the following issues for this high-risk pregnancy and has Language barrier affecting health care; Supervision of other normal pregnancy, antepartum; History of preterm delivery; Type 2 diabetes mellitus (HCC); and Abnormal glucose tolerance test (GTT) during pregnancy, antepartum on their problem list.  Patient reports no complaints.  Contractions: Not present. Vag. Bleeding: None.  Movement: Present. Denies leaking of fluid.   The following portions of the patient's history were reviewed and updated as appropriate: allergies, current medications, past family history, past medical history, past social history, past surgical history and problem list.   Objective:   Vitals:   04/20/20 0849  BP: 107/69  Pulse: (!) 102  Weight: 126 lb (57.2 kg)    Fetal Status: Fetal Heart Rate (bpm): 150   Movement: Present     General:  Alert, oriented and cooperative. Patient is in no acute distress.  Skin: Skin is warm and dry. No rash noted.   Cardiovascular: Normal heart rate noted  Respiratory: Normal respiratory effort, no problems with respiration noted  Abdomen: Soft, gravid, appropriate for gestational age.  Pain/Pressure: Present     Pelvic: Cervical exam deferred        Extremities: Normal range of motion.  Edema: None  Mental Status: Normal mood and affect. Normal behavior. Normal judgment and thought content.   Assessment and Plan:  Pregnancy: [id number] at [redacted]w[redacted]d 1. Type 2 diabetes mellitus without complication, without long-term current use of insulin (HCC)  taking meds as directed Was not on ASA yet, will start today Blood sugars all within range Need to schedule opth exam  2. Language barrier affecting health care Interpretor used  3. [redacted] weeks gestation of pregnancy Has growth [place] today Will schedule fetal  echocardiogram  Preterm labor symptoms and general obstetric precautions including but not limited to vaginal bleeding, contractions, leaking of fluid and fetal movement were reviewed in detail with the patient. Please refer to After Visit Summary for other counseling recommendations.   Return in about 2 weeks (around 05/04/2020) for [facility].  Future Appointments  Date Time Provider Department Center  04/20/2020  1:30 PM [facility] NURSE [facility]  04/20/2020  1:45 PM [facility] US4 [facility]  04/28/2020 11:00 AM [facility] NURSE [facility]  04/28/2020 11:15 AM [facility] US2 [facility]  05/04/2020  8:40 AM Aviva Signs, CNM [facility] None  05/04/2020 11:00 AM [facility] NURSE [facility]  05/04/2020 11:15 AM [facility] US2 [facility]    Wynelle Bourgeois, CNM

## 2020-04-20 NOTE — Progress Notes (Signed)
Clydie Braun interpreter [phone] telephonic interpreter used for visit. Armandina Stammer RN

## 2020-04-28 ENCOUNTER — Other Ambulatory Visit: Payer: Self-pay | Admitting: *Deleted

## 2020-04-28 ENCOUNTER — Encounter: Payer: Self-pay | Admitting: *Deleted

## 2020-04-28 ENCOUNTER — Ambulatory Visit: Payer: Medicaid Other | Admitting: *Deleted

## 2020-04-28 ENCOUNTER — Other Ambulatory Visit: Payer: Self-pay

## 2020-04-28 ENCOUNTER — Ambulatory Visit: Payer: Medicaid Other | Attending: Maternal & Fetal Medicine

## 2020-04-28 DIAGNOSIS — O09213 Supervision of pregnancy with history of pre-term labor, third trimester: Secondary | ICD-10-CM

## 2020-04-28 DIAGNOSIS — Z362 Encounter for other antenatal screening follow-up: Secondary | ICD-10-CM

## 2020-04-28 DIAGNOSIS — O24113 Pre-existing diabetes mellitus, type 2, in pregnancy, third trimester: Secondary | ICD-10-CM | POA: Diagnosis present

## 2020-04-28 DIAGNOSIS — O24415 Gestational diabetes mellitus in pregnancy, controlled by oral hypoglycemic drugs: Secondary | ICD-10-CM | POA: Diagnosis not present

## 2020-04-28 DIAGNOSIS — Z3480 Encounter for supervision of other normal pregnancy, unspecified trimester: Secondary | ICD-10-CM

## 2020-04-28 DIAGNOSIS — Z3A32 32 weeks gestation of pregnancy: Secondary | ICD-10-CM

## 2020-04-28 DIAGNOSIS — O358XX0 Maternal care for other (suspected) fetal abnormality and damage, not applicable or unspecified: Secondary | ICD-10-CM

## 2020-04-28 DIAGNOSIS — O409XX0 Polyhydramnios, unspecified trimester, not applicable or unspecified: Secondary | ICD-10-CM

## 2020-04-28 NOTE — Progress Notes (Signed)
Pulse 112 by palpation

## 2020-05-04 ENCOUNTER — Ambulatory Visit (INDEPENDENT_AMBULATORY_CARE_PROVIDER_SITE_OTHER): Payer: Medicaid Other | Admitting: Advanced Practice Midwife

## 2020-05-04 ENCOUNTER — Encounter: Payer: Self-pay | Admitting: *Deleted

## 2020-05-04 ENCOUNTER — Ambulatory Visit: Payer: Medicaid Other | Admitting: *Deleted

## 2020-05-04 ENCOUNTER — Ambulatory Visit: Payer: Medicaid Other | Attending: Maternal & Fetal Medicine

## 2020-05-04 ENCOUNTER — Encounter: Payer: Self-pay | Admitting: Advanced Practice Midwife

## 2020-05-04 ENCOUNTER — Other Ambulatory Visit: Payer: Self-pay

## 2020-05-04 VITALS — BP 115/71 | HR 109 | Wt 128.0 lb

## 2020-05-04 DIAGNOSIS — Z3A33 33 weeks gestation of pregnancy: Secondary | ICD-10-CM

## 2020-05-04 DIAGNOSIS — E119 Type 2 diabetes mellitus without complications: Secondary | ICD-10-CM | POA: Diagnosis not present

## 2020-05-04 DIAGNOSIS — O09213 Supervision of pregnancy with history of pre-term labor, third trimester: Secondary | ICD-10-CM | POA: Diagnosis not present

## 2020-05-04 DIAGNOSIS — Z789 Other specified health status: Secondary | ICD-10-CM

## 2020-05-04 DIAGNOSIS — O358XX0 Maternal care for other (suspected) fetal abnormality and damage, not applicable or unspecified: Secondary | ICD-10-CM | POA: Diagnosis not present

## 2020-05-04 DIAGNOSIS — Z3480 Encounter for supervision of other normal pregnancy, unspecified trimester: Secondary | ICD-10-CM

## 2020-05-04 DIAGNOSIS — O24113 Pre-existing diabetes mellitus, type 2, in pregnancy, third trimester: Secondary | ICD-10-CM | POA: Insufficient documentation

## 2020-05-04 DIAGNOSIS — O24415 Gestational diabetes mellitus in pregnancy, controlled by oral hypoglycemic drugs: Secondary | ICD-10-CM

## 2020-05-04 NOTE — Progress Notes (Signed)
Clydie Braun interpreter used for visit [phone]. Armandina Stammer RN

## 2020-05-04 NOTE — Progress Notes (Signed)
   PRENATAL VISIT NOTE  Subjective:  Kathleen Henson is a 24 y.o. [id number] at [redacted]w[redacted]d being seen today for ongoing prenatal care.  She is currently monitored for the following issues for this high-risk pregnancy and has Language barrier affecting health care; Supervision of other normal pregnancy, antepartum; History of preterm delivery; Type 2 diabetes mellitus (HCC); and Abnormal glucose tolerance test (GTT) during pregnancy, antepartum on their problem list.  Patient reports occasional contractions. States cramps are occasional and do not last long at all  .  Marland Kitchen  Movement: Present. Denies leaking of fluid.   The following portions of the patient's history were reviewed and updated as appropriate: allergies, current medications, past family history, past medical history, past social history, past surgical history and problem list.   Objective:   Vitals:   05/04/20 0838  BP: 115/71  Pulse: (!) 109  Weight: 128 lb (58.1 kg)    Fetal Status: Fetal Heart Rate (bpm): 150 Fundal Height: 33 cm Movement: Present     General:  Alert, oriented and cooperative. Patient is in no acute distress.  Skin: Skin is warm and dry. No rash noted.   Cardiovascular: Normal heart rate noted  Respiratory: Normal respiratory effort, no problems with respiration noted  Abdomen: Soft, gravid, appropriate for gestational age.        Pelvic: Cervical exam deferred        Extremities: Normal range of motion.  Edema: None  Mental Status: Normal mood and affect. Normal behavior. Normal judgment and thought content.   Assessment and Plan:  Pregnancy: A5W0981 at [redacted]w[redacted]d 1. Supervision of other normal pregnancy, antepartum      Weekly NSTs and BPPs, reactive today  2. Type 2 diabetes mellitus without complication, without long-term current use of insulin (HCC)      All values within normal limits, none of range  3. Language barrier affecting health care    Video interpretor used  4. [redacted] weeks gestation of  pregnancy   Preterm labor symptoms and general obstetric precautions including but not limited to vaginal bleeding, contractions, leaking of fluid and fetal movement were reviewed in detail with the patient. Please refer to After Visit Summary for other counseling recommendations.   Return in about 2 weeks (around 05/18/2020) for [employer].  Future Appointments  Date Time Provider Department Center  05/04/2020 11:00 AM [facility] NURSE [facility]  05/04/2020 11:15 AM [facility] US2 [facility]  05/12/2020  2:30 PM [facility] NURSE [facility]  05/12/2020  2:45 PM [facility] US4 [facility]  05/18/2020  9:20 AM Aviva Signs, CNM [facility] None  05/18/2020  3:00 PM [facility] NURSE [facility]  05/18/2020  3:15 PM [facility] US2 [facility]  05/25/2020  2:30 PM [facility] NURSE [facility]  05/25/2020  2:45 PM [facility] US5 [facility]    Wynelle Bourgeois, CNM

## 2020-05-04 NOTE — Patient Instructions (Signed)

## 2020-05-12 ENCOUNTER — Ambulatory Visit: Payer: Medicaid Other | Attending: Obstetrics

## 2020-05-12 ENCOUNTER — Encounter: Payer: Self-pay | Admitting: *Deleted

## 2020-05-12 ENCOUNTER — Other Ambulatory Visit: Payer: Self-pay

## 2020-05-12 ENCOUNTER — Ambulatory Visit: Payer: Medicaid Other | Admitting: *Deleted

## 2020-05-12 DIAGNOSIS — O09213 Supervision of pregnancy with history of pre-term labor, third trimester: Secondary | ICD-10-CM | POA: Diagnosis not present

## 2020-05-12 DIAGNOSIS — Z3A34 34 weeks gestation of pregnancy: Secondary | ICD-10-CM

## 2020-05-12 DIAGNOSIS — Z3480 Encounter for supervision of other normal pregnancy, unspecified trimester: Secondary | ICD-10-CM | POA: Diagnosis present

## 2020-05-12 DIAGNOSIS — O358XX0 Maternal care for other (suspected) fetal abnormality and damage, not applicable or unspecified: Secondary | ICD-10-CM

## 2020-05-12 DIAGNOSIS — O24415 Gestational diabetes mellitus in pregnancy, controlled by oral hypoglycemic drugs: Secondary | ICD-10-CM | POA: Diagnosis not present

## 2020-05-12 DIAGNOSIS — O409XX0 Polyhydramnios, unspecified trimester, not applicable or unspecified: Secondary | ICD-10-CM

## 2020-05-12 DIAGNOSIS — O403XX0 Polyhydramnios, third trimester, not applicable or unspecified: Secondary | ICD-10-CM

## 2020-05-12 DIAGNOSIS — O359XX0 Maternal care for (suspected) fetal abnormality and damage, unspecified, not applicable or unspecified: Secondary | ICD-10-CM

## 2020-05-12 NOTE — Progress Notes (Signed)
Pulse 104 by palpation 

## 2020-05-13 ENCOUNTER — Other Ambulatory Visit: Payer: Self-pay | Admitting: *Deleted

## 2020-05-13 DIAGNOSIS — O24419 Gestational diabetes mellitus in pregnancy, unspecified control: Secondary | ICD-10-CM

## 2020-05-17 ENCOUNTER — Other Ambulatory Visit: Payer: Self-pay

## 2020-05-17 MED ORDER — METFORMIN HCL 500 MG PO TABS
500.0000 mg | ORAL_TABLET | Freq: Two times a day (BID) | ORAL | 1 refills | Status: DC
Start: 2020-05-17 — End: 2020-06-05

## 2020-05-18 ENCOUNTER — Encounter: Payer: Self-pay | Admitting: *Deleted

## 2020-05-18 ENCOUNTER — Ambulatory Visit: Payer: Medicaid Other | Attending: Obstetrics

## 2020-05-18 ENCOUNTER — Ambulatory Visit (INDEPENDENT_AMBULATORY_CARE_PROVIDER_SITE_OTHER): Payer: Medicaid Other | Admitting: Advanced Practice Midwife

## 2020-05-18 ENCOUNTER — Other Ambulatory Visit: Payer: Self-pay | Admitting: *Deleted

## 2020-05-18 ENCOUNTER — Ambulatory Visit: Payer: Medicaid Other | Admitting: *Deleted

## 2020-05-18 ENCOUNTER — Other Ambulatory Visit: Payer: Self-pay

## 2020-05-18 ENCOUNTER — Encounter: Payer: Self-pay | Admitting: Advanced Practice Midwife

## 2020-05-18 VITALS — BP 128/68 | HR 102 | Wt 133.0 lb

## 2020-05-18 DIAGNOSIS — O403XX0 Polyhydramnios, third trimester, not applicable or unspecified: Secondary | ICD-10-CM | POA: Diagnosis not present

## 2020-05-18 DIAGNOSIS — Z3480 Encounter for supervision of other normal pregnancy, unspecified trimester: Secondary | ICD-10-CM

## 2020-05-18 DIAGNOSIS — O24415 Gestational diabetes mellitus in pregnancy, controlled by oral hypoglycemic drugs: Secondary | ICD-10-CM | POA: Diagnosis not present

## 2020-05-18 DIAGNOSIS — O409XX0 Polyhydramnios, unspecified trimester, not applicable or unspecified: Secondary | ICD-10-CM | POA: Diagnosis present

## 2020-05-18 DIAGNOSIS — O358XX0 Maternal care for other (suspected) fetal abnormality and damage, not applicable or unspecified: Secondary | ICD-10-CM | POA: Diagnosis not present

## 2020-05-18 DIAGNOSIS — Z362 Encounter for other antenatal screening follow-up: Secondary | ICD-10-CM

## 2020-05-18 DIAGNOSIS — Z789 Other specified health status: Secondary | ICD-10-CM

## 2020-05-18 DIAGNOSIS — E119 Type 2 diabetes mellitus without complications: Secondary | ICD-10-CM

## 2020-05-18 DIAGNOSIS — O09213 Supervision of pregnancy with history of pre-term labor, third trimester: Secondary | ICD-10-CM | POA: Diagnosis not present

## 2020-05-18 DIAGNOSIS — O24113 Pre-existing diabetes mellitus, type 2, in pregnancy, third trimester: Secondary | ICD-10-CM

## 2020-05-18 DIAGNOSIS — Z3A35 35 weeks gestation of pregnancy: Secondary | ICD-10-CM | POA: Diagnosis not present

## 2020-05-18 DIAGNOSIS — O4703 False labor before 37 completed weeks of gestation, third trimester: Secondary | ICD-10-CM

## 2020-05-18 DIAGNOSIS — O479 False labor, unspecified: Secondary | ICD-10-CM

## 2020-05-18 NOTE — Progress Notes (Signed)
   PRENATAL VISIT NOTE  Subjective:  Kathleen Henson is a 24 y.o. [id number] at [redacted]w[redacted]d being seen today for ongoing prenatal care.  She is currently monitored for the following issues for this high-risk pregnancy and has Language barrier affecting health care; Supervision of other normal pregnancy, antepartum; History of preterm delivery; Type 2 diabetes mellitus (HCC); and Abnormal glucose tolerance test (GTT) during pregnancy, antepartum on their problem list.  Patient reports occasional contractions. States some are painful while others are not.   Contractions: Irritability. Vag. Bleeding: None.  Movement: Present. Denies leaking of fluid.   The following portions of the patient's history were reviewed and updated as appropriate: allergies, current medications, past family history, past medical history, past social history, past surgical history and problem list.   Objective:   Vitals:   05/18/20 0911  BP: 128/68  Pulse: (!) 102  Weight: 133 lb (60.3 kg)    Fetal Status: Fetal Heart Rate (bpm): NST   Movement: Present     General:  Alert, oriented and cooperative. Patient is in no acute distress.  Skin: Skin is warm and dry. No rash noted.   Cardiovascular: Normal heart rate noted  Respiratory: Normal respiratory effort, no problems with respiration noted  Abdomen: Soft, gravid, appropriate for gestational age.  Pain/Pressure: Present     Pelvic:         Extremities: Normal range of motion.  Edema: None  Mental Status: Normal mood and affect. Normal behavior. Normal judgment and thought content.   Assessment and Plan:  Pregnancy: [id number] at [redacted]w[redacted]d 1. Supervision of other normal pregnancy, antepartum     NST is reactive with irregular mild contractions, pt states these are not painful  2. [redacted] weeks gestation of pregnancy     Cultures next week  3. Type 2 diabetes mellitus without complication, without long-term current use of insulin (HCC) Blood sugars mostly within range.  Of 10 days,  there are 2-3 levels at breakfast of 120-121, one value of 127 at dinner. All others normal  [place] scheduled per MFM  4. Language barrier affecting health care Video interpretor used  5. Braxton Hicks contractions  Discussed BH vs PTL.  Patient to notify us and come in if contractions become painful  Preterm labor symptoms and general obstetric precautions including but not limited to vaginal bleeding, contractions, leaking of fluid and fetal movement were reviewed in detail with the patient. Please refer to After Visit Summary for other counseling recommendations.   Return in about 1 week (around 05/25/2020) for [facility].  Future Appointments  Date Time Provider Department Center  05/18/2020  3:00 PM [facility] NURSE [facility]  05/18/2020  3:15 PM [facility] US2 [facility]  05/25/2020  9:20 AM Aviva Signs, CNM [facility] None  05/25/2020  2:30 PM [facility] NURSE [facility]  05/25/2020  2:45 PM [facility] US5 [facility]  06/01/2020 10:30 AM [facility] NURSE [facility]  06/01/2020 10:45 AM [facility] US6 [facility]  06/02/2020  9:15 AM Levie Heritage, DO [facility] None  06/08/2020  9:20 AM Aviva Signs, CNM [facility] None  06/15/2020 10:10 AM Aviva Signs, CNM [facility] None    Wynelle Bourgeois, CNM

## 2020-05-18 NOTE — Patient Instructions (Signed)

## 2020-05-18 NOTE — Progress Notes (Signed)
Video interpreter Naisun #[id number] Armandina Stammer RN

## 2020-05-25 ENCOUNTER — Ambulatory Visit (INDEPENDENT_AMBULATORY_CARE_PROVIDER_SITE_OTHER): Payer: Medicaid Other | Admitting: Advanced Practice Midwife

## 2020-05-25 ENCOUNTER — Encounter: Payer: Self-pay | Admitting: Advanced Practice Midwife

## 2020-05-25 ENCOUNTER — Encounter: Payer: Self-pay | Admitting: *Deleted

## 2020-05-25 ENCOUNTER — Other Ambulatory Visit: Payer: Self-pay

## 2020-05-25 ENCOUNTER — Ambulatory Visit: Payer: Medicaid Other | Admitting: *Deleted

## 2020-05-25 ENCOUNTER — Other Ambulatory Visit (HOSPITAL_COMMUNITY)
Admission: RE | Admit: 2020-05-25 | Discharge: 2020-05-25 | Disposition: A | Discharge status: Home / Self Care | Payer: Medicaid Other | Source: Ambulatory Visit | Attending: Obstetrics and Gynecology | Admitting: Obstetrics and Gynecology

## 2020-05-25 ENCOUNTER — Ambulatory Visit: Payer: Medicaid Other | Attending: Obstetrics

## 2020-05-25 VITALS — BP 121/76 | HR 116 | Wt 135.0 lb

## 2020-05-25 DIAGNOSIS — Z3480 Encounter for supervision of other normal pregnancy, unspecified trimester: Secondary | ICD-10-CM | POA: Insufficient documentation

## 2020-05-25 DIAGNOSIS — O24415 Gestational diabetes mellitus in pregnancy, controlled by oral hypoglycemic drugs: Secondary | ICD-10-CM | POA: Diagnosis not present

## 2020-05-25 DIAGNOSIS — O403XX0 Polyhydramnios, third trimester, not applicable or unspecified: Secondary | ICD-10-CM | POA: Diagnosis not present

## 2020-05-25 DIAGNOSIS — O09213 Supervision of pregnancy with history of pre-term labor, third trimester: Secondary | ICD-10-CM

## 2020-05-25 DIAGNOSIS — O409XX0 Polyhydramnios, unspecified trimester, not applicable or unspecified: Secondary | ICD-10-CM | POA: Insufficient documentation

## 2020-05-25 DIAGNOSIS — E119 Type 2 diabetes mellitus without complications: Secondary | ICD-10-CM | POA: Insufficient documentation

## 2020-05-25 DIAGNOSIS — Z362 Encounter for other antenatal screening follow-up: Secondary | ICD-10-CM

## 2020-05-25 DIAGNOSIS — Z3A36 36 weeks gestation of pregnancy: Secondary | ICD-10-CM

## 2020-05-25 DIAGNOSIS — O359XX0 Maternal care for (suspected) fetal abnormality and damage, unspecified, not applicable or unspecified: Secondary | ICD-10-CM

## 2020-05-25 DIAGNOSIS — Z7984 Long term (current) use of oral hypoglycemic drugs: Secondary | ICD-10-CM

## 2020-05-25 NOTE — Progress Notes (Signed)
   PRENATAL VISIT NOTE  Subjective:  Kathleen Henson is a 24 y.o. [id number] at 109w3d being seen today for ongoing prenatal care.  She is currently monitored for the following issues for this high-risk pregnancy and has Language barrier affecting health care; Supervision of other normal pregnancy, antepartum; History of preterm delivery; Type 2 diabetes mellitus (HCC); and Abnormal glucose tolerance test (GTT) during pregnancy, antepartum on their problem list.  Patient reports no complaints  Except contractions seem stronger when she has them.  About every 7-15 minutes.  Contractions: Not present. Vag. Bleeding: None.  Movement: Present. Denies leaking of fluid.   The following portions of the patient's history were reviewed and updated as appropriate: allergies, current medications, past family history, past medical history, past social history, past surgical history and problem list.   Objective:   Vitals:   05/25/20 0927  BP: 121/76  Pulse: (!) 116  Weight: 135 lb (61.2 kg)    Fetal Status: Fetal Heart Rate (bpm): NST Fundal Height: 35 cm Movement: Present  Presentation: Vertex  General:  Alert, oriented and cooperative. Patient is in no acute distress.  Skin: Skin is warm and dry. No rash noted.   Cardiovascular: Normal heart rate noted  Respiratory: Normal respiratory effort, no problems with respiration noted  Abdomen: Soft, gravid, appropriate for gestational age.  Pain/Pressure: Absent     Pelvic: Cervical exam performed in the presence of a chaperone Dilation: 1 Effacement (%): Thick Station: Ballotable  Extremities: Normal range of motion.  Edema: None  Mental Status: Normal mood and affect. Normal behavior. Normal judgment and thought content.   Assessment and Plan:  Pregnancy: [id number] at [redacted]w[redacted]d 1. Supervision of other normal pregnancy, antepartum      NST reactive  - Culture, beta strep (group b only) - GC/Chlamydia probe amp ([place])not at [facility]  2. Type 2 diabetes  mellitus without complication, without long-term current use of insulin (HCC)     ALL values within normal limits     Continue fetal surveillance      Deliver at 39 weeks  - Culture, beta strep (group b only) - GC/Chlamydia probe amp ([place])not at [facility]  3.   Preterm irregular contractions     Cervix 1/thick/ballot today      Contractions only q7-16 min       PTL precautions  Preterm labor symptoms and general obstetric precautions including but not limited to vaginal bleeding, contractions, leaking of fluid and fetal movement were reviewed in detail with the patient. Please refer to After Visit Summary for other counseling recommendations.    Future Appointments  Date Time Provider Department Center  05/25/2020  2:30 PM [facility] NURSE [facility]  05/25/2020  2:45 PM [facility] US5 [facility]  06/01/2020 10:30 AM [facility]URSE [facility]  06/01/2020 10:45 AM [facility] US6 [facility]  06/02/2020  9:15 AM Adrian Blackwater, Rhona Raider, DO [facility] None  06/08/2020  9:20 AM Aviva Signs, CNM [facility] None  06/09/2020 12:30 PM [facility] NURSE [facility]  06/09/2020 12:45 PM [facility] US4 [facility]  06/15/2020 10:10 AM Aviva Signs, CNM [facility] None    Wynelle Bourgeois, CNM

## 2020-05-25 NOTE — Progress Notes (Signed)
Baird Lyons video interpreter used for visit. #[id number] Armandina Stammer RN

## 2020-05-25 NOTE — Patient Instructions (Signed)

## 2020-05-26 LAB — GC/CHLAMYDIA PROBE AMP (~~LOC~~) NOT AT ARMC
Chlamydia: NEGATIVE
Comment: NEGATIVE
Comment: NORMAL
Neisseria Gonorrhea: NEGATIVE

## 2020-05-29 LAB — CULTURE, BETA STREP (GROUP B ONLY): Strep Gp B Culture: NEGATIVE

## 2020-06-01 ENCOUNTER — Other Ambulatory Visit: Payer: Self-pay

## 2020-06-01 ENCOUNTER — Ambulatory Visit: Payer: Medicaid Other | Admitting: *Deleted

## 2020-06-01 ENCOUNTER — Encounter: Payer: Self-pay | Admitting: *Deleted

## 2020-06-01 ENCOUNTER — Ambulatory Visit: Payer: Medicaid Other | Attending: Obstetrics and Gynecology

## 2020-06-01 DIAGNOSIS — O358XX0 Maternal care for other (suspected) fetal abnormality and damage, not applicable or unspecified: Secondary | ICD-10-CM | POA: Diagnosis not present

## 2020-06-01 DIAGNOSIS — O24419 Gestational diabetes mellitus in pregnancy, unspecified control: Secondary | ICD-10-CM | POA: Diagnosis present

## 2020-06-01 DIAGNOSIS — O24415 Gestational diabetes mellitus in pregnancy, controlled by oral hypoglycemic drugs: Secondary | ICD-10-CM | POA: Diagnosis not present

## 2020-06-01 DIAGNOSIS — O403XX0 Polyhydramnios, third trimester, not applicable or unspecified: Secondary | ICD-10-CM

## 2020-06-01 DIAGNOSIS — O09213 Supervision of pregnancy with history of pre-term labor, third trimester: Secondary | ICD-10-CM

## 2020-06-01 DIAGNOSIS — Z3480 Encounter for supervision of other normal pregnancy, unspecified trimester: Secondary | ICD-10-CM | POA: Insufficient documentation

## 2020-06-01 DIAGNOSIS — Z3A37 37 weeks gestation of pregnancy: Secondary | ICD-10-CM

## 2020-06-02 ENCOUNTER — Encounter: Payer: Self-pay | Admitting: Family Medicine

## 2020-06-02 ENCOUNTER — Telehealth (HOSPITAL_COMMUNITY): Payer: Self-pay | Admitting: *Deleted

## 2020-06-02 ENCOUNTER — Other Ambulatory Visit: Payer: Self-pay | Admitting: Advanced Practice Midwife

## 2020-06-02 ENCOUNTER — Ambulatory Visit (INDEPENDENT_AMBULATORY_CARE_PROVIDER_SITE_OTHER): Payer: Medicaid Other | Admitting: Family Medicine

## 2020-06-02 ENCOUNTER — Encounter (HOSPITAL_COMMUNITY): Payer: Self-pay | Admitting: *Deleted

## 2020-06-02 VITALS — BP 123/77 | HR 99 | Wt 136.0 lb

## 2020-06-02 DIAGNOSIS — Z3A37 37 weeks gestation of pregnancy: Secondary | ICD-10-CM

## 2020-06-02 DIAGNOSIS — Z3480 Encounter for supervision of other normal pregnancy, unspecified trimester: Secondary | ICD-10-CM

## 2020-06-02 DIAGNOSIS — E119 Type 2 diabetes mellitus without complications: Secondary | ICD-10-CM | POA: Diagnosis not present

## 2020-06-02 DIAGNOSIS — Z789 Other specified health status: Secondary | ICD-10-CM

## 2020-06-02 DIAGNOSIS — O409XX0 Polyhydramnios, unspecified trimester, not applicable or unspecified: Secondary | ICD-10-CM

## 2020-06-02 NOTE — Telephone Encounter (Signed)
Preadmission screen Interpreter number [phone]

## 2020-06-02 NOTE — Progress Notes (Signed)
Naisun # [id number] video interpreter used for visit.  Patient would like to discuss induction ( for diabetes)

## 2020-06-02 NOTE — Progress Notes (Signed)
Subjective:  Kathleen Henson is a 24 y.o. [id number] at [redacted]w[redacted]d being seen today for ongoing prenatal care.  She is currently monitored for the following issues for this high-risk pregnancy and has Language barrier affecting health care; Supervision of other normal pregnancy, antepartum; History of preterm delivery; Type 2 diabetes mellitus (HCC); and Abnormal glucose tolerance test (GTT) during pregnancy, antepartum on their problem list.  GDM: Patient taking metformin.  Reports no hypoglycemic episodes.  Tolerating medication well Fasting: controlled 2hr PP: two slightly elevated values (121-122).  Patient reports no complaints.  Contractions: Not present. Vag. Bleeding: None.  Movement: Present. Denies leaking of fluid.   The following portions of the patient's history were reviewed and updated as appropriate: allergies, current medications, past family history, past medical history, past social history, past surgical history and problem list. Problem list updated.  Objective:   Vitals:   06/02/20 0915  BP: 123/77  Pulse: 99  Weight: 136 lb (61.7 kg)    Fetal Status: Fetal Heart Rate (bpm): NST   Movement: Present     General:  Alert, oriented and cooperative. Patient is in no acute distress.  Skin: Skin is warm and dry. No rash noted.   Cardiovascular: Normal heart rate noted  Respiratory: Normal respiratory effort, no problems with respiration noted  Abdomen: Soft, gravid, appropriate for gestational age. Pain/Pressure: Absent     Pelvic: Vag. Bleeding: None     Cervical exam deferred        Extremities: Normal range of motion.  Edema: None  Mental Status: Normal mood and affect. Normal behavior. Normal judgment and thought content.   Urinalysis:      Assessment and Plan:  Pregnancy: [name] at [redacted]w[redacted]d  1. [redacted] weeks gestation of pregnancy 2. Supervision of other normal pregnancy, antepartum FHT and FH normal  3. Type 2 diabetes mellitus without complication, without long-term current  use of insulin (HCC) Continue metformin BPP yesterday 8/8 NST today -  Induce at 39 weeks.  4. Language barrier affecting health care  5. Polyhydramnios affecting pregnancy   Term labor symptoms and general obstetric precautions including but not limited to vaginal bleeding, contractions, leaking of fluid and fetal movement were reviewed in detail with the patient. Please refer to After Visit Summary for other counseling recommendations.  Return in about 1 week (around 06/09/2020).   Levie Heritage, DO

## 2020-06-04 ENCOUNTER — Encounter (HOSPITAL_COMMUNITY): Payer: Self-pay | Admitting: Obstetrics & Gynecology

## 2020-06-04 ENCOUNTER — Inpatient Hospital Stay (HOSPITAL_COMMUNITY)
Admission: AD | Admit: 2020-06-04 | Discharge: 2020-06-05 | DRG: 805 | Disposition: A | Discharge status: Home / Self Care | Payer: Medicaid Other | Attending: Obstetrics & Gynecology | Admitting: Obstetrics & Gynecology

## 2020-06-04 ENCOUNTER — Other Ambulatory Visit: Payer: Self-pay

## 2020-06-04 DIAGNOSIS — Z3A37 37 weeks gestation of pregnancy: Secondary | ICD-10-CM | POA: Diagnosis not present

## 2020-06-04 DIAGNOSIS — Z20822 Contact with and (suspected) exposure to covid-19: Secondary | ICD-10-CM | POA: Diagnosis present

## 2020-06-04 DIAGNOSIS — O2412 Pre-existing diabetes mellitus, type 2, in childbirth: Secondary | ICD-10-CM | POA: Diagnosis present

## 2020-06-04 DIAGNOSIS — E119 Type 2 diabetes mellitus without complications: Secondary | ICD-10-CM | POA: Diagnosis present

## 2020-06-04 DIAGNOSIS — Z7984 Long term (current) use of oral hypoglycemic drugs: Secondary | ICD-10-CM | POA: Diagnosis not present

## 2020-06-04 DIAGNOSIS — O26893 Other specified pregnancy related conditions, third trimester: Secondary | ICD-10-CM | POA: Diagnosis present

## 2020-06-04 DIAGNOSIS — Z3480 Encounter for supervision of other normal pregnancy, unspecified trimester: Secondary | ICD-10-CM

## 2020-06-04 LAB — CBC
HCT: 35 % — ABNORMAL LOW (ref 36.0–46.0)
Hemoglobin: 11.9 g/dL — ABNORMAL LOW (ref 12.0–15.0)
MCH: 27 pg (ref 26.0–34.0)
MCHC: 34 g/dL (ref 30.0–36.0)
MCV: 79.5 fL — ABNORMAL LOW (ref 80.0–100.0)
Platelets: 338 10*3/uL (ref 150–400)
RBC: 4.4 MIL/uL (ref 3.87–5.11)
RDW: 12.7 % (ref 11.5–15.5)
WBC: 13.7 10*3/uL — ABNORMAL HIGH (ref 4.0–10.5)
nRBC: 0 % (ref 0.0–0.2)

## 2020-06-04 LAB — RESP PANEL BY RT-PCR (FLU A&B, COVID) ARPGX2
Influenza A by PCR: NEGATIVE
Influenza B by PCR: NEGATIVE
SARS Coronavirus 2 by RT PCR: NEGATIVE

## 2020-06-04 LAB — TYPE AND SCREEN
ABO/RH(D): O POS
Antibody Screen: NEGATIVE

## 2020-06-04 LAB — RPR: RPR Ser Ql: NONREACTIVE

## 2020-06-04 MED ORDER — WITCH HAZEL-GLYCERIN EX PADS: 1.0000 "application " | MEDICATED_PAD | CUTANEOUS | Status: DC | PRN

## 2020-06-04 MED ORDER — OXYTOCIN BOLUS FROM INFUSION: 333.0000 mL | Freq: Once | INTRAVENOUS | Status: DC

## 2020-06-04 MED ORDER — LIDOCAINE HCL (PF) 1 % IJ SOLN: 30.0000 mL | INTRAMUSCULAR | Status: DC | PRN

## 2020-06-04 MED ORDER — ONDANSETRON HCL 4 MG/2ML IJ SOLN: 4.0000 mg | INTRAMUSCULAR | Status: DC | PRN

## 2020-06-04 MED ORDER — LACTATED RINGERS IV SOLN: INTRAVENOUS | Status: DC

## 2020-06-04 MED ORDER — DIBUCAINE (PERIANAL) 1 % EX OINT: 1.0000 "application " | TOPICAL_OINTMENT | CUTANEOUS | Status: DC | PRN

## 2020-06-04 MED ORDER — ONDANSETRON HCL 4 MG/2ML IJ SOLN: 4.0000 mg | Freq: Four times a day (QID) | INTRAMUSCULAR | Status: DC | PRN

## 2020-06-04 MED ORDER — SENNOSIDES-DOCUSATE SODIUM 8.6-50 MG PO TABS
2.0000 | ORAL_TABLET | ORAL | Status: DC
  Administered 2020-06-04: 2 via ORAL
  Filled 2020-06-04: qty 2

## 2020-06-04 MED ORDER — IBUPROFEN 600 MG PO TABS
600.0000 mg | ORAL_TABLET | Freq: Four times a day (QID) | ORAL | Status: DC
  Administered 2020-06-04 – 2020-06-05 (×4): 600 mg via ORAL
  Filled 2020-06-04 (×5): qty 1

## 2020-06-04 MED ORDER — BENZOCAINE-MENTHOL 20-0.5 % EX AERO
1.0000 "application " | INHALATION_SPRAY | CUTANEOUS | Status: DC | PRN
  Administered 2020-06-04: 1 via TOPICAL
  Filled 2020-06-04: qty 56

## 2020-06-04 MED ORDER — OXYTOCIN-SODIUM CHLORIDE 30-0.9 UT/500ML-% IV SOLN: 2.5000 [IU]/h | INTRAVENOUS | Status: DC

## 2020-06-04 MED ORDER — DIPHENHYDRAMINE HCL 25 MG PO CAPS: 25.0000 mg | ORAL_CAPSULE | Freq: Four times a day (QID) | ORAL | Status: DC | PRN

## 2020-06-04 MED ORDER — IBUPROFEN 600 MG PO TABS: 600.0000 mg | ORAL_TABLET | Freq: Once | ORAL | Status: DC

## 2020-06-04 MED ORDER — PRENATAL MULTIVITAMIN CH
1.0000 | ORAL_TABLET | Freq: Every day | ORAL | Status: DC
  Administered 2020-06-04 – 2020-06-05 (×2): 1 via ORAL
  Filled 2020-06-04 (×2): qty 1

## 2020-06-04 MED ORDER — OXYCODONE-ACETAMINOPHEN 5-325 MG PO TABS: 2.0000 | ORAL_TABLET | ORAL | Status: DC | PRN

## 2020-06-04 MED ORDER — SOD CITRATE-CITRIC ACID 500-334 MG/5ML PO SOLN: 30.0000 mL | ORAL | Status: DC | PRN

## 2020-06-04 MED ORDER — ONDANSETRON HCL 4 MG PO TABS: 4.0000 mg | ORAL_TABLET | ORAL | Status: DC | PRN

## 2020-06-04 MED ORDER — ACETAMINOPHEN 325 MG PO TABS: 650.0000 mg | ORAL_TABLET | ORAL | Status: DC | PRN

## 2020-06-04 MED ORDER — LACTATED RINGERS IV SOLN: 500.0000 mL | INTRAVENOUS | Status: DC | PRN

## 2020-06-04 MED ORDER — MEDROXYPROGESTERONE ACETATE 150 MG/ML IM SUSP: 150.0000 mg | INTRAMUSCULAR | Status: DC | PRN

## 2020-06-04 MED ORDER — LIDOCAINE HCL (PF) 1 % IJ SOLN
INTRAMUSCULAR | Status: AC
  Filled 2020-06-04: qty 30

## 2020-06-04 MED ORDER — SIMETHICONE 80 MG PO CHEW: 80.0000 mg | CHEWABLE_TABLET | ORAL | Status: DC | PRN

## 2020-06-04 MED ORDER — ACETAMINOPHEN 325 MG PO TABS
650.0000 mg | ORAL_TABLET | ORAL | Status: DC | PRN
  Administered 2020-06-04 – 2020-06-05 (×3): 650 mg via ORAL
  Filled 2020-06-04 (×3): qty 2

## 2020-06-04 MED ORDER — LIDOCAINE HCL (PF) 1 % IJ SOLN
30.0000 mL | INTRAMUSCULAR | Status: AC | PRN
  Administered 2020-06-04: 30 mL via SUBCUTANEOUS

## 2020-06-04 MED ORDER — FERROUS SULFATE 325 (65 FE) MG PO TABS: 325.0000 mg | ORAL_TABLET | ORAL | Status: DC

## 2020-06-04 MED ORDER — TETANUS-DIPHTH-ACELL PERTUSSIS 5-2.5-18.5 LF-MCG/0.5 IM SUSY: 0.5000 mL | PREFILLED_SYRINGE | Freq: Once | INTRAMUSCULAR | Status: DC

## 2020-06-04 MED ORDER — ACETAMINOPHEN 325 MG PO TABS
650.0000 mg | ORAL_TABLET | ORAL | Status: DC | PRN
  Administered 2020-06-04: 650 mg via ORAL
  Filled 2020-06-04: qty 2

## 2020-06-04 MED ORDER — OXYTOCIN 10 UNIT/ML IJ SOLN
10.0000 [IU] | Freq: Once | INTRAMUSCULAR | Status: AC
  Administered 2020-06-04: 10 [IU] via INTRAMUSCULAR

## 2020-06-04 MED ORDER — OXYCODONE-ACETAMINOPHEN 5-325 MG PO TABS: 1.0000 | ORAL_TABLET | ORAL | Status: DC | PRN

## 2020-06-04 MED ORDER — OXYTOCIN 10 UNIT/ML IJ SOLN
INTRAMUSCULAR | Status: AC
  Filled 2020-06-04: qty 2

## 2020-06-04 MED ORDER — COCONUT OIL OIL: 1.0000 "application " | TOPICAL_OIL | Status: DC | PRN

## 2020-06-04 MED ORDER — FLEET ENEMA 7-19 GM/118ML RE ENEM: 1.0000 | ENEMA | RECTAL | Status: DC | PRN

## 2020-06-04 MED ORDER — MEASLES, MUMPS & RUBELLA VAC IJ SOLR: 0.5000 mL | Freq: Once | INTRAMUSCULAR | Status: DC

## 2020-06-04 NOTE — MAU Note (Signed)
Call received from front desk- "pt in lobby pushing". Pt found on floor in bathroom, large puddle of clear fluid under her.  Husband said her water just broke.  3 rd baby, due neg wk on Saturday.pt taken to rm in MAU, [place] CNM examined pt "rim", L&D charge nurse called, notified of above, rm assigned, pt transferred to L&D via stretcher, CNM present for transfer.

## 2020-06-04 NOTE — Discharge Summary (Addendum)
Postpartum Discharge Summary    Patient Name: Kathleen Henson DOB: 02/01/1996 MRN: [id number]  Date of admission: 06/04/2020 Delivery date:06/04/2020  Delivering provider: Layla Barter  Date of discharge: 06/05/2020  Admitting diagnosis: Normal labor [O80, Z37.9] Intrauterine pregnancy: [redacted]w[redacted]d    Secondary diagnosis:  Active Problems:   Normal labor  Additional problems: None  Discharge diagnosis: Term Pregnancy Delivered                                              Post partum procedures:n/a Augmentation: N/A Complications: None  Hospital course: Onset of Labor With Vaginal Delivery      24y.o. yo [id number] 374w6das admitted in Active Labor on 06/04/2020. Patient had an uncomplicated labor course as follows:  Membrane Rupture Time/Date: 8:13 AM ,06/04/2020   Delivery Method:Vaginal, Spontaneous  Episiotomy: None  Lacerations:  1st degree;Perineal  Patient had an uncomplicated postpartum course.  She is ambulating, tolerating a regular diet, passing flatus, and urinating well. Patient is discharged home in stable condition on 06/05/20.  Newborn Data: Birth date:06/04/2020  Birth time:8:20 AM  Gender:Female  Living status:Living  Apgars:9 ,9  Weight:3250 g   Magnesium Sulfate received: No BMZ received: No Rhophylac:N/A MMR:N/A T-DaP:Given prenatally Flu: offered prior to discharge Transfusion:No  Physical exam  Vitals:   06/04/20 1114 06/04/20 1500 06/04/20 1947 06/05/20 0529  BP: 103/70 107/64 128/74 100/70  Pulse: 75 96 67 66  Resp: _0 Temp: 98.3 F (36.8 C) 98.2 F (36.8 C) 98 F (36.7 C) 98.3 F (36.8 C)  TempSrc:   Oral Oral  SpO2:   98% 98%  Weight:      Height:       General: alert and no distress Lochia: appropriate Uterine Fundus: firm Incision: N/A DVT Evaluation: No evidence of DVT seen on physical exam. Labs: Lab Results  Component Value Date   WBC 13.7 (H) 06/04/2020   HGB 11.9 (L) 06/04/2020   HCT 35.0 (L) 06/04/2020   MCV  79.5 (L) 06/04/2020   PLT 338 06/04/2020   CMP Latest Ref Rng & Units 02/01/2020  Glucose 70 - 99 mg/dL 89  BUN 6 - 20 mg/dL 5(L)  Creatinine 0.44 - 1.00 mg/dL 0.48  Sodium 135 - 145 mmol/L 136  Potassium 3.5 - 5.1 mmol/L 4.0  Chloride 98 - 111 mmol/L 104  CO2 22 - 32 mmol/L 23  Calcium 8.9 - 10.3 mg/dL 8.8(L)  Total Protein 6.5 - 8.1 g/dL 6.6  Total Bilirubin 0.3 - 1.2 mg/dL 0.6  Alkaline Phos 38 - 126 U/L 52  AST 15 - 41 U/L 16  ALT 0 - 44 U/L 17   Edinburgh Score: Edinburgh Postnatal Depression Scale Screening Tool 06/04/2020  I have been able to laugh and see the funny side of things. 0  I have looked forward with enjoyment to things. 0  I have blamed myself unnecessarily when things went wrong. 0  I have been anxious or worried for no good reason. 0  I have felt scared or panicky for no good reason. 0  Things have been getting on top of me. 0  I have been so unhappy that I have had difficulty sleeping. 0  I have felt sad or miserable. 0  I have been so unhappy that I have been crying. 0  The thought of harming myself has  occurred to me. 0  Edinburgh Postnatal Depression Scale Total 0     After visit meds:  Allergies as of 06/05/2020   No Known Allergies     Medication List    STOP taking these medications   Accu-Chek Guide w/Device Kit   Accu-Chek Softclix Lancets lancets   Doxylamine-Pyridoxine 10-10 MG Tbec Commonly known as: Diclegis   glucose blood test strip   metFORMIN 500 MG tablet Commonly known as: Glucophage     TAKE these medications   acetaminophen 325 MG tablet Commonly known as: TYLENOL Take 650 mg by mouth every 6 (six) hours as needed for moderate pain.   aspirin 81 MG chewable tablet Chew 1 tablet (81 mg total) by mouth daily.   ibuprofen 600 MG tablet Commonly known as: ADVIL Take 1 tablet (600 mg total) by mouth every 6 (six) hours.   omeprazole 40 MG capsule Commonly known as: PRILOSEC TAKE 1 CAPSULE(40 MG) BY MOUTH DAILY    prenatal multivitamin Tabs tablet Take 1 tablet by mouth daily at 12 noon.   Prenate Mini 18-0.6-0.4-350 MG Caps Take 1 capsule by mouth daily.   senna 8.6 MG Tabs tablet Commonly known as: SENOKOT Take 1 tablet (8.6 mg total) by mouth at bedtime.      Discharge home in stable condition Infant Feeding: Breast Infant Disposition:home with mother Discharge instruction: per After Visit Summary and Postpartum booklet. Activity: Advance as tolerated. Pelvic rest for 6 weeks.  Diet: routine diet Future Appointments: Future Appointments  Date Time Provider [place]  06/08/2020  9:20 AM Seabron Spates, CNM [facility] None   Follow up Visit:  [facility] [place]. Schedule an appointment as soon as possible for a visit in 6 week(s).   Specialty: Obstetrics and Gynecology Why: PPartum appointment and GDM testing Contact information: [address] [place] [address] [phone]             Please schedule this patient for a In person postpartum visit in 4 weeks with the following provider: Any provider. Additional Postpartum F/U:None  High risk pregnancy complicated by: V6PV? Delivery mode:  Vaginal, Spontaneous NSVD  Anticipated Birth Control: Declines  Charlton Haws, MD PGY-1 Family Medicine Resident    Attestation of Supervision of Student:  I confirm that I have verified the information documented in the resident student's note and that I have also personally reperformed the history, physical exam and all medical decision making activities.  I have verified that all services and findings are accurately documented in this student's note; and I agree with management and plan as outlined in the documentation. I have also made any necessary editorial changes. A Santiago Glad interpreter was used for my entire visit.  Clarisa Fling, NP [facility] [employer] [place] [facility] 06/05/2020 10:36 AM

## 2020-06-04 NOTE — OB Triage Provider Note (Signed)
Called to lobby bathroom by RN, pt water broke and she is feeling urge to push in the bathroom.  Pt is 24 y.o. [id number] at [redacted]w[redacted]d with medical hx significant for T2DM, EFW 37%tile.  RN assisted pt to wheelchair and she was brought to MAU room. Cervix checked by CNM and was anterior lip. Pt transferred to L&D room with CNM present and care handed off to labor team.  Pt husband present on arrival and was transferred to L&D with pt.

## 2020-06-04 NOTE — Plan of Care (Signed)
Isabel Ardila, RN 

## 2020-06-05 LAB — GLUCOSE, CAPILLARY: Glucose-Capillary: 74 mg/dL (ref 70–99)

## 2020-06-05 MED ORDER — SENNA 8.6 MG PO TABS: 1.0000 | ORAL_TABLET | Freq: Every day | ORAL | 1 refills | Status: DC

## 2020-06-05 MED ORDER — IBUPROFEN 600 MG PO TABS: 600.0000 mg | ORAL_TABLET | Freq: Four times a day (QID) | ORAL | 0 refills | Status: DC

## 2020-06-05 NOTE — Discharge Instructions (Signed)

## 2020-06-08 ENCOUNTER — Encounter: Payer: Medicaid Other | Admitting: Advanced Practice Midwife

## 2020-06-09 ENCOUNTER — Ambulatory Visit: Payer: Medicaid Other

## 2020-06-10 ENCOUNTER — Other Ambulatory Visit (HOSPITAL_COMMUNITY): Payer: Medicaid Other

## 2020-06-12 ENCOUNTER — Inpatient Hospital Stay (HOSPITAL_COMMUNITY)
Admission: AD | Admit: 2020-06-12 | Payer: Medicaid Other | Source: Home / Self Care | Admitting: Obstetrics and Gynecology

## 2020-06-12 ENCOUNTER — Inpatient Hospital Stay (HOSPITAL_COMMUNITY): Payer: Medicaid Other

## 2020-06-15 ENCOUNTER — Encounter: Payer: Medicaid Other | Admitting: Advanced Practice Midwife

## 2020-06-17 NOTE — Progress Notes (Signed)
All discharge information reviewed in detail using the Dexter Clydie Braun Interpreter [phone].    All questions answered.  FOB at bedside and work note given to him.  He understands that we can not write a work note to excuse him from work for one week.    F/U appointments discussed.

## 2020-06-22 ENCOUNTER — Telehealth: Payer: Self-pay

## 2020-06-22 ENCOUNTER — Encounter: Payer: Self-pay | Admitting: Family Medicine

## 2020-06-22 NOTE — Telephone Encounter (Signed)
Per Marva, 2 calls have been made via the language line and messages have been left on vm regarding follow up appointment. Pt has not returned any phone calls. A letter was mailed to the patient. Kery Batzel l Nava Song, CMA

## 2020-06-22 NOTE — H&P (Signed)
Kathleen Henson is a 24 y.o. female presenting for active labor. OB History    Gravida  4   Para  3   Term  2   Preterm  1   AB  1   Living  3     SAB  1   IAB      Ectopic      Multiple  0   Live Births  3          Past Medical History:  Diagnosis Date  . Diabetes mellitus without complication (HCC)    Type 2   Past Surgical History:  Procedure Laterality Date  . NO PAST SURGERIES     Family History: family history is not on file. Social History:  reports that she has never smoked. She has never used smokeless tobacco. She reports that she does not drink alcohol and does not use drugs.     Maternal Diabetes: Yes:  Diabetes Type:  Pre-pregnancy Genetic Screening: Normal Maternal Ultrasounds/Referrals: Normal Fetal Ultrasounds or other Referrals:  None Maternal Substance Abuse:  No Significant Maternal Medications:  None Significant Maternal Lab Results:  Group B Strep negative Other Comments:  None  Review of Systems  All other systems reviewed and are negative.  Maternal Medical History:  Reason for admission: Contractions.   Fetal activity: Perceived fetal activity is normal.    Prenatal Complications - Diabetes: type 2.      Blood pressure 100/70, pulse 66, temperature 98.3 F (36.8 C), temperature source Oral, resp. rate 16, height 4\' 10"  (1.473 m), weight 61.7 kg, last menstrual period 09/13/2019, SpO2 98 %, unknown if currently breastfeeding. Maternal Exam:  Uterine Assessment: Contraction strength is firm.  Contraction frequency is regular.   Introitus: Normal vulva. Normal vagina.  Pelvis: adequate for delivery.   Cervix: Cervix evaluated by digital exam.     Fetal Exam Fetal Monitor Review: Mode: ultrasound.   Variability: moderate (6-25 bpm).   Pattern: accelerations present and no decelerations.    Fetal State Assessment: Category I - tracings are normal.     Physical Exam Vitals and nursing note reviewed. Exam conducted with a  chaperone present.  Constitutional:      General: She is not in acute distress. HENT:     Head: Normocephalic.  Cardiovascular:     Rate and Rhythm: Normal rate.  Pulmonary:     Effort: Pulmonary effort is normal.  Genitourinary:    General: Normal vulva.  Skin:    General: Skin is warm and dry.  Neurological:     Mental Status: She is alert and oriented to person, place, and time.  Psychiatric:        Mood and Affect: Mood normal.        Behavior: Behavior normal.     Prenatal labs: ABO, Rh: --/--/O POS (12/03 0946) Antibody: NEG (12/03 0946) Rubella: 3.21 (05/18 1201) RPR: NON REACTIVE (12/03 0947)  HBsAg: Negative (05/18 1201)  HIV: Non Reactive (09/21 0944)  GBS: Negative/-- (11/23 1008)   Assessment/Plan: 24 y.o. 21 at [redacted]w[redacted]d Reassuring M-F status Admit to  Labor and delivery  Delivery imminent   [redacted]w[redacted]d DNP, CNM  06/22/20  8:13 AM

## 2020-08-09 ENCOUNTER — Ambulatory Visit (INDEPENDENT_AMBULATORY_CARE_PROVIDER_SITE_OTHER): Payer: Medicaid Other | Admitting: Obstetrics and Gynecology

## 2020-08-09 ENCOUNTER — Other Ambulatory Visit: Payer: Self-pay

## 2020-08-09 ENCOUNTER — Encounter: Payer: Self-pay | Admitting: Obstetrics and Gynecology

## 2020-08-09 DIAGNOSIS — E119 Type 2 diabetes mellitus without complications: Secondary | ICD-10-CM

## 2020-08-09 DIAGNOSIS — Z789 Other specified health status: Secondary | ICD-10-CM

## 2020-08-09 MED ORDER — METFORMIN HCL 500 MG PO TABS: 500.0000 mg | ORAL_TABLET | Freq: Two times a day (BID) | ORAL | 5 refills | Status: DC

## 2020-08-09 NOTE — Progress Notes (Signed)
Obstetrics/Postpartum Visit  Appointment Date: 08/09/2020  OBGYN Clinic: [facility]  Primary Care Provider: Califon Henson  Chief Complaint: No chief complaint on file.   History of Present Illness: Kathleen Henson is a 25 y.o. Asian [id number] (Patient's last menstrual period was 09/13/2019 (exact date).), seen for the above chief complaint. Her past medical history is significant for Type 2 DM   She is s/p spontaneous vaginal delivery on 06/04/20 at 37 weeks; she was discharged to home on PPD#1. Pregnancy complicated by T2DM.  Complains of nothing. Doing well overall.  Vaginal bleeding or discharge: No  Breast or formula feeding: both Intercourse: No  Contraception: condoms PP depression s/s: No  Any bowel or bladder issues: No  Pap smear: no abnormalities (date: 11/2019)  Review of Systems: Positive for n/a.   Her 12 point review of systems is negative or as noted in the History of Present Illness.  Patient Active Problem List   Diagnosis Date Noted  . Normal labor 06/04/2020  . Abnormal glucose tolerance test (GTT) during pregnancy, antepartum 12/16/2019  . Type 2 diabetes mellitus (HCC) 11/19/2019  . Supervision of other normal pregnancy, antepartum 11/18/2019  . History of preterm delivery 11/18/2019  . Language barrier affecting health care 08/03/2017    Medications Kathleen Henson had no medications administered during this visit. Current Outpatient Medications  Medication Sig Dispense Refill  . metFORMIN (GLUCOPHAGE) 500 MG tablet Take 1 tablet (500 mg total) by mouth 2 (two) times daily with a meal. 60 tablet 5  . acetaminophen (TYLENOL) 325 MG tablet Take 650 mg by mouth every 6 (six) hours as needed for moderate pain.    Marland Kitchen aspirin 81 MG chewable tablet Chew 1 tablet (81 mg total) by mouth daily. 30 tablet 4  . ibuprofen (ADVIL) 600 MG tablet Take 1 tablet (600 mg total) by mouth every 6 (six) hours. 30 tablet 0  . omeprazole (PRILOSEC) 40 MG capsule TAKE 1  CAPSULE(40 MG) BY MOUTH DAILY 60 capsule 0  . Prenat-FeCbn-FeAsp-Meth-FA-DHA (PRENATE MINI) 18-0.6-0.4-350 MG CAPS Take 1 capsule by mouth daily.    . Prenatal Vit-Fe Fumarate-FA (PRENATAL MULTIVITAMIN) TABS tablet Take 1 tablet by mouth daily at 12 noon. 30 tablet 10  . senna (SENOKOT) 8.6 MG TABS tablet Take 1 tablet (8.6 mg total) by mouth at bedtime. 30 tablet 1   No current facility-administered medications for this visit.    Allergies Patient has no known allergies.  Physical Exam:  BP 112/65   Pulse 73   Ht 4\' 10"  (1.473 m)   Wt 116 lb (52.6 kg)   LMP 09/13/2019 (Exact Date)   Breastfeeding Yes   BMI 24.24 kg/m  Body mass index is 24.24 kg/m. General appearance: Well nourished, well developed female in no acute distress.  Cardiovascular: regular rate and rhythm Respiratory:   Normal respiratory effort Abdomen: no masses, hernias; diffusely non tender to palpation, non distended Breasts: not examined. Neuro/Psych:  Normal mood and affect.  Skin:  Warm and dry.    PP Depression Screening:    Edinburgh Postnatal Depression Scale - 08/09/20 1102      Edinburgh Postnatal Depression Scale:  In the Past 7 Days   I have been able to laugh and see the funny side of things. 0    I have looked forward with enjoyment to things. 0    I have blamed myself unnecessarily when things went wrong. 0    I have been anxious or worried for no good reason. 0  I have felt scared or panicky for no good reason. 0    Things have been getting on top of me. 0    I have been so unhappy that I have had difficulty sleeping. 0    I have felt sad or miserable. 0    I have been so unhappy that I have been crying. 0    The thought of harming myself has occurred to me. 0    Edinburgh Postnatal Depression Scale Total 0           Assessment: Patient is a 25 y.o. [id number] who is 4 weeks post partum from a spontaneous vaginal delivery. She is doing well.   Plan:  1. Postpartum state Doing  well  2. Language barrier affecting health care Kathleen Henson used  3. Type 2 diabetes mellitus without complication, without long-term current use of insulin (HCC) - F/u at health department - pt reports she was on oral medication prior to pregnancy, does not know what, cannot find this in records, will send Metformin Rx until she can get back in with health department - Ambulatory referral to [facility]  Essential components of care per ACOG recommendations:  1.  Mood and well being: Patient with negative depression screening today. Reviewed local resources for support.  - Patient does not use tobacco.  - hx of drug use? No    2. Infant care and feeding:  -Patient currently breastmilk feeding? Yes Reviewed importance of draining breast regularly to support lactation. -Social determinants of health (SDOH) reviewed in EPIC. No concerns  3. Sexuality, contraception and birth spacing - Patient does not want a pregnancy in the next year.  Desired family size is unknown children.  - Reviewed forms of contraception in tiered fashion. Patient desired condoms today.   - Discussed birth spacing of 18 months  4. Sleep and fatigue -Encouraged family/partner/community support of 4 hrs of uninterrupted sleep to help with mood and fatigue  5. Physical Recovery  - Discussed patients delivery and complications - Patient had a no laceration, perineal healing reviewed. Patient expressed understanding - Patient has urinary incontinence? No  - Patient is safe to resume physical and sexual activity  6.  Health Maintenance - Last pap smear done 11/2019 and was normal with negative HPV.  7. Chronic Disease - PCP follow up   RTC 1 year   K. Therese Sarah, MD, [facility] Attending Center for [facility])

## 2020-08-09 NOTE — Progress Notes (Signed)
error 

## 2020-08-17 ENCOUNTER — Other Ambulatory Visit: Payer: Self-pay | Admitting: Advanced Practice Midwife

## 2020-08-17 DIAGNOSIS — Z789 Other specified health status: Secondary | ICD-10-CM

## 2020-08-17 DIAGNOSIS — Z3A31 31 weeks gestation of pregnancy: Secondary | ICD-10-CM

## 2020-08-17 DIAGNOSIS — E119 Type 2 diabetes mellitus without complications: Secondary | ICD-10-CM

## 2020-12-22 IMAGING — US US MFM OB FOLLOW-UP
1 series · 13 of 28 positions shown · non-contrast
Comparison: none

[Series 1: us mfm ob follow-up · 65 acquisitions, 13 frames shown]
[im 3/65]
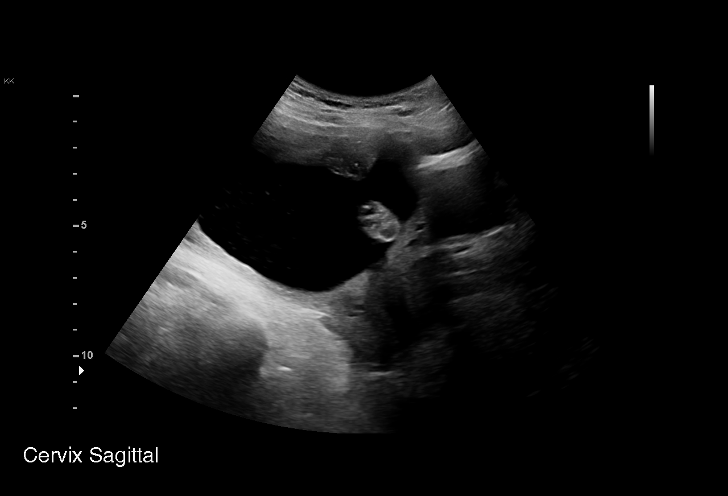
[im 8/65]
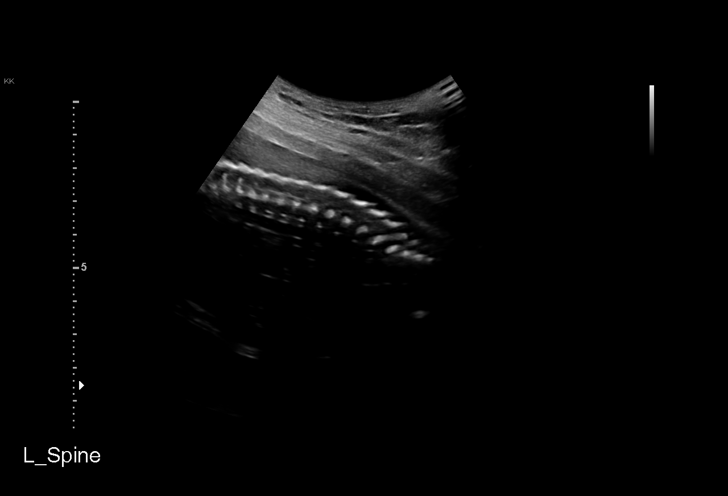
[im 12/65]
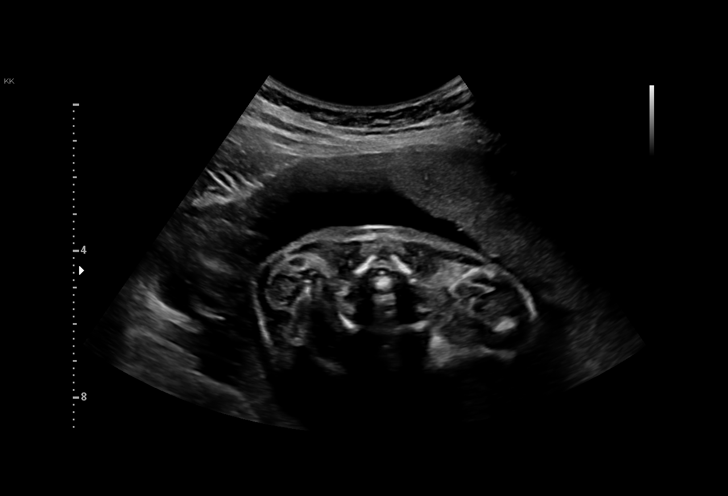
[im 17/65]
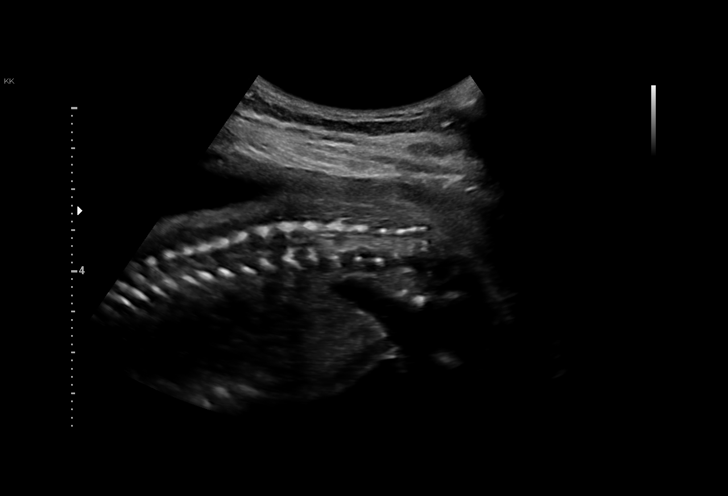
[im 22/65]
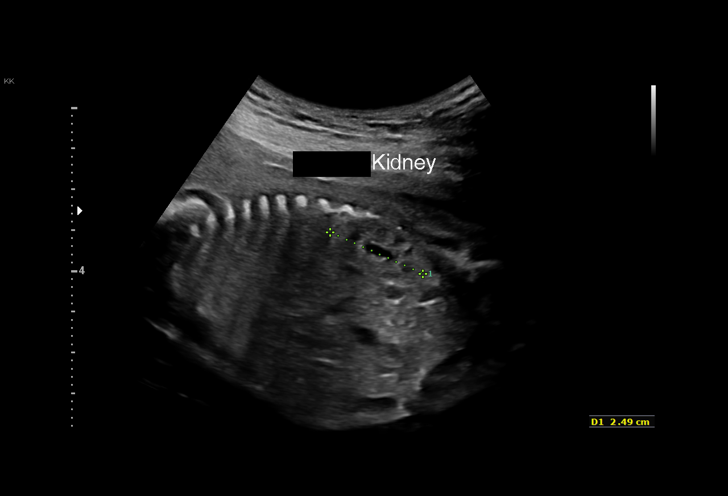
[im 27/65]
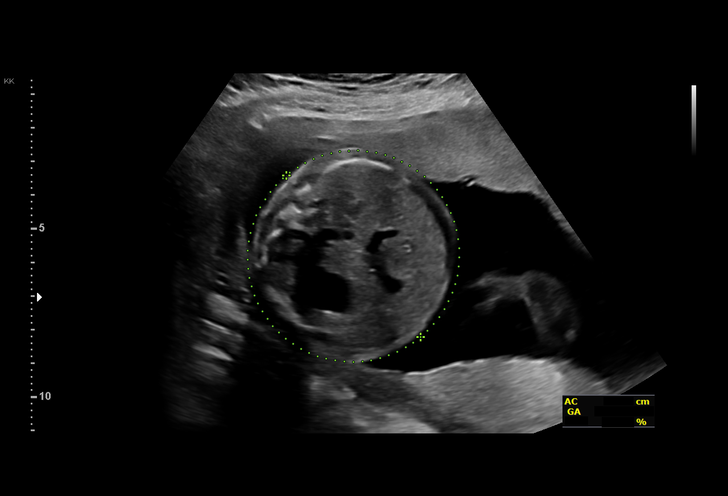
[im 34/65]
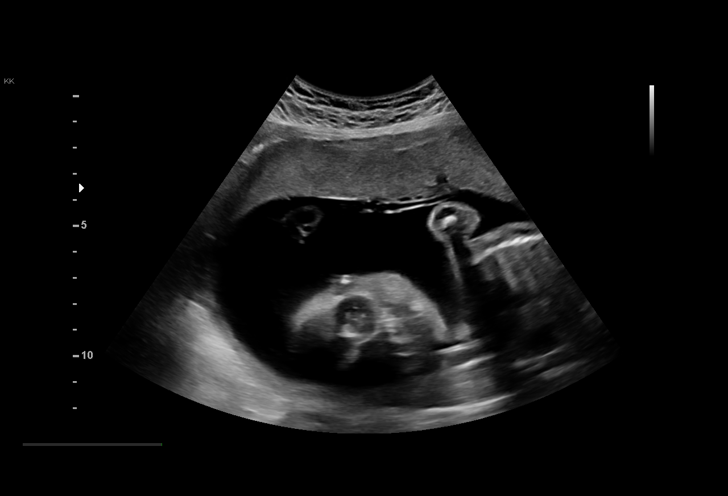
[im 38/65]
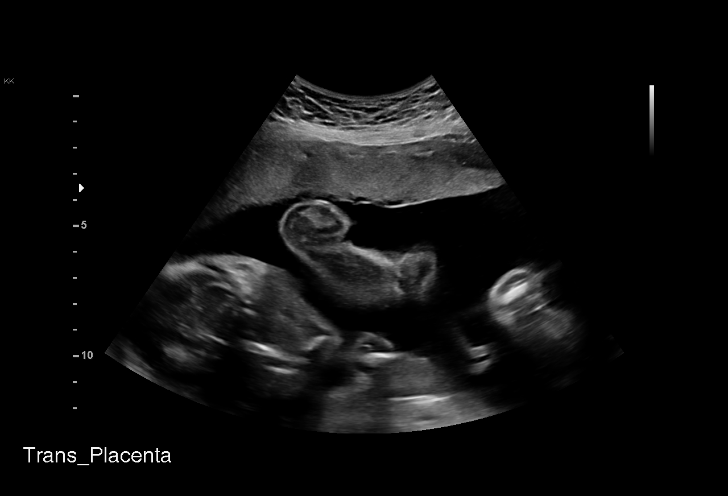
[im 43/65]
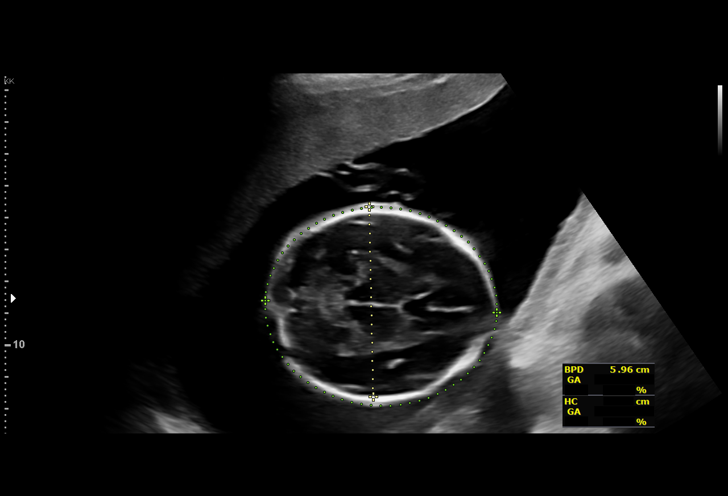
[im 48/65]
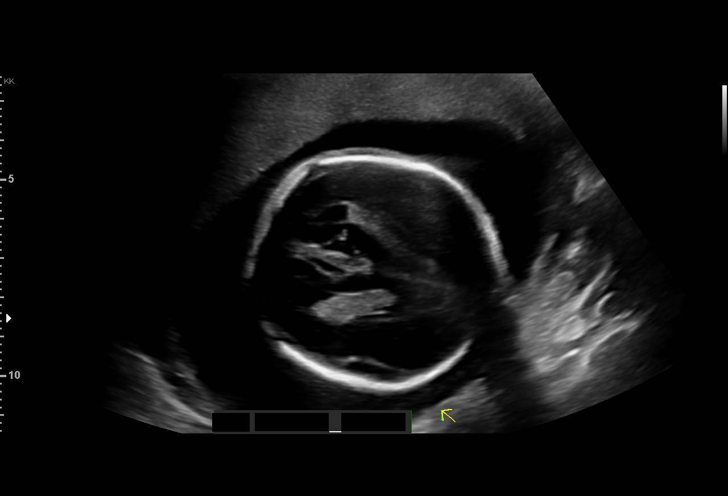
[im 53/65]
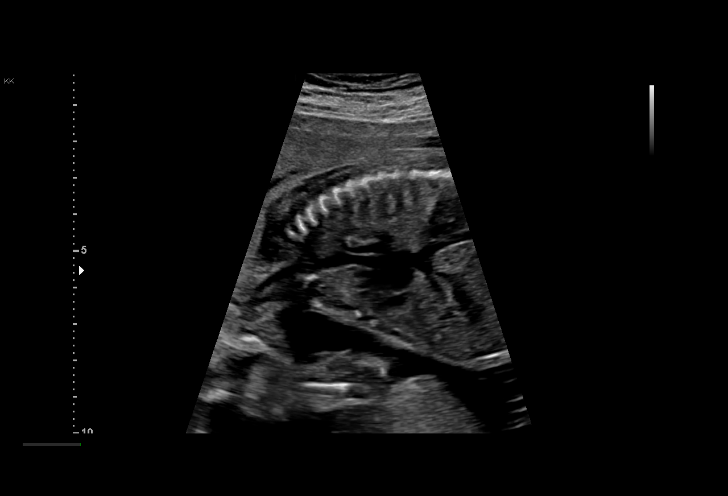
[im 57/65]
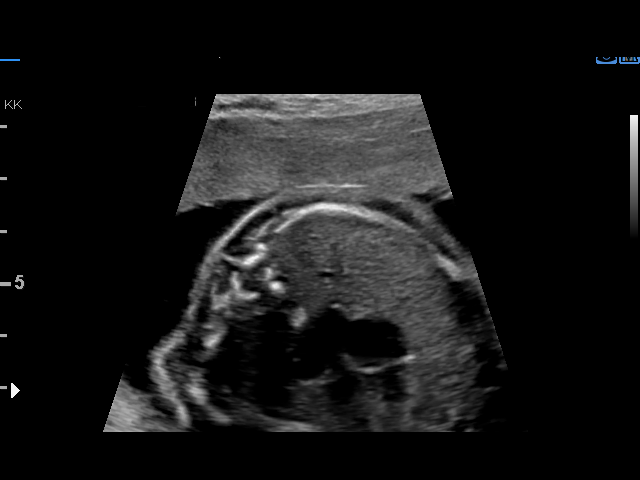
[im 62/65]
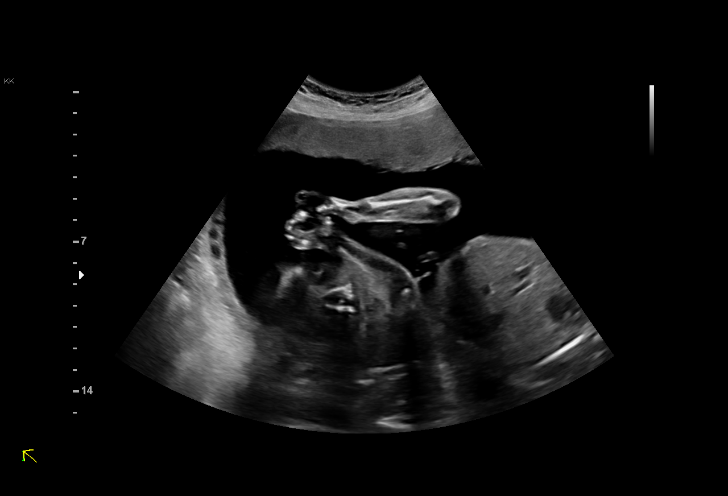

[13 of 28 positions shown; findings below may reference images not displayed]

[REDACTED]
                   MODE CNM

Indications

 Gestational diabetes in pregnancy,
 controlled by oral hypoglycemic drugs
 Poor obstetric history: Previous preterm
 delivery, antepartum
 Fetal choroid plexus cyst (Resolved)
 Encounter for other antenatal screening
 follow-up
 (Negative Quad Screen)(Negative AFP)
 23 weeks gestation of pregnancy
Vital Signs

                                                Height:        5'
Fetal Evaluation

 Num Of Fetuses:         1
 Fetal Heart Rate(bpm):  149
 Cardiac Activity:       Observed
 Presentation:           Breech
 Placenta:               Anterior
 P. Cord Insertion:      Previously Visualized

 Amniotic Fluid
 AFI FV:      Within normal limits

                             Largest Pocket(cm)
                             6.
Biometry

 BPD:      59.4  mm     G. Age:  24w 2d         75  %    CI:        78.47   %    70 - 86
                                                         FL/HC:      18.0   %    19.2 -
 HC:      212.1  mm     G. Age:  23w 2d         28  %    HC/AC:      1.09        1.05 -
 AC:      194.5  mm     G. Age:  24w 1d         65  %    FL/BPD:     64.1   %    71 - 87
 FL:       38.1  mm     G. Age:  22w 1d          8  %    FL/AC:      19.6   %    20 - 24

 Est. FW:     585  gm      1 lb 5 oz     37  %
OB History

 Gravidity:    4         Term:   1        Prem:   1        SAB:   1
 Living:       2
Gestational Age

 LMP:           23w 3d        Date:  09/13/19                 EDD:   06/19/20
 U/S Today:     23w 3d                                        EDD:   06/19/20
 Best:          23w 3d     Det. By:  LMP  (09/13/19)          EDD:   06/19/20
Anatomy

 Cranium:               Appears normal         Aortic Arch:            Previously seen
 Cavum:                 Appears normal         Ductal Arch:            Previously seen
 Ventricles:            Appears normal         Diaphragm:              Appears normal
 Choroid Plexus:        Appears normal         Stomach:                Appears normal, left
                                                                       sided
 Cerebellum:            Previously seen        Abdomen:                Appears normal
 Posterior Fossa:       Appears normal         Abdominal Wall:         Previously seen
 Nuchal Fold:           Previously seen        Cord Vessels:           Previously seen
 Face:                  Orbits previously      Kidneys:                Appear normal
                        seen
 Lips:                  Previously seen        Bladder:                Appears normal
 Thoracic:              Appears normal         Spine:                  Appears normal
 Heart:                 Appears normal         Upper Extremities:      Previously seen
                        (4CH, axis, and
                        situs)
 RVOT:                  Appears normal         Lower Extremities:      Previously seen
 LVOT:                  Appears normal

 Other:  Fetus appears to be female. Heels, 5th digit, Open hands, and Nasal
         bone previously visualized. Technically difficult due to fetal position.
Cervix Uterus Adnexa

 Cervix
 Length:           3.05  cm.
 Normal appearance by transabdominal scan.
Impression

 Patient returned for completion of fetal anatomy .Fetal
 biometry is consistent with her previously-established dates
 .Amniotic fluid is normal and good fetal activity is seen .Fetal
 spine appears normal. Profile could not be visualized
 because of fetal position.

 Choroid plexus cysts have resolved.

 We reassured the patient of the findings. I counseled the
 patient with help of DAJKA interpreter (STRATUS).
Recommendations

 -An appointment was made for her to return in 4 weeks for
 fetal growth assessment.
                 Grandson, Nickholos

## 2021-02-24 IMAGING — US US MFM OB FOLLOW-UP
1 series · 13 of 28 positions shown · non-contrast
Comparison: none

[Series 1: us mfm ob follow-up · 79 acquisitions, 13 frames shown]
[im 3/79]
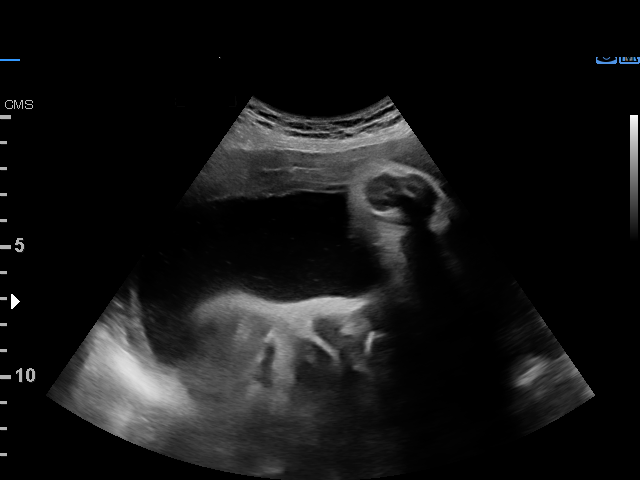
[im 9/79]
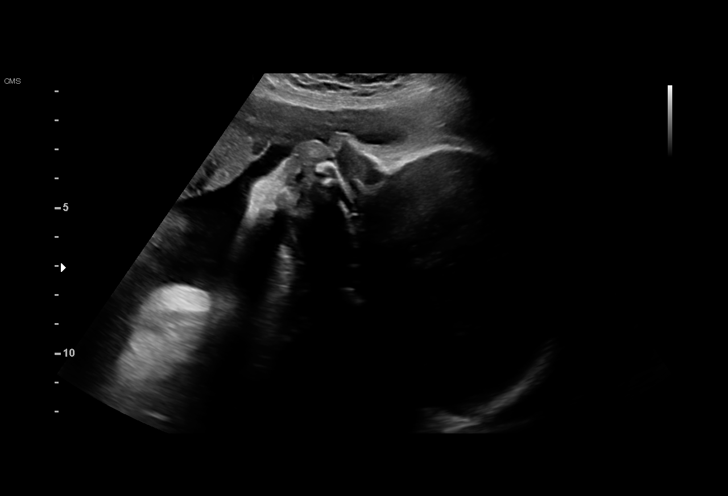
[im 15/79]
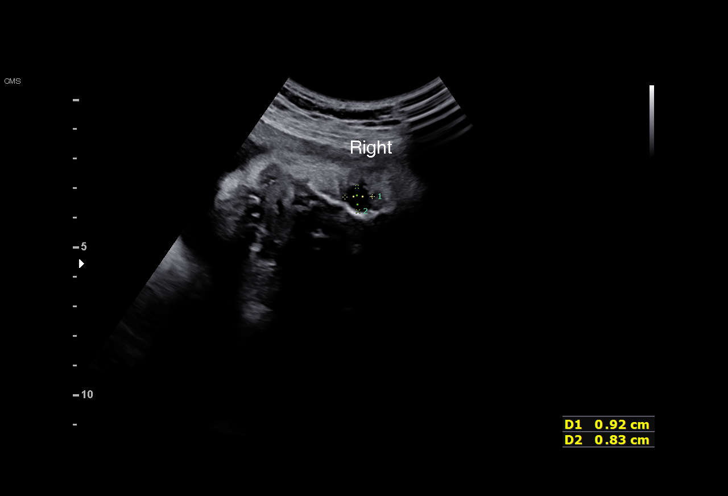
[im 21/79]
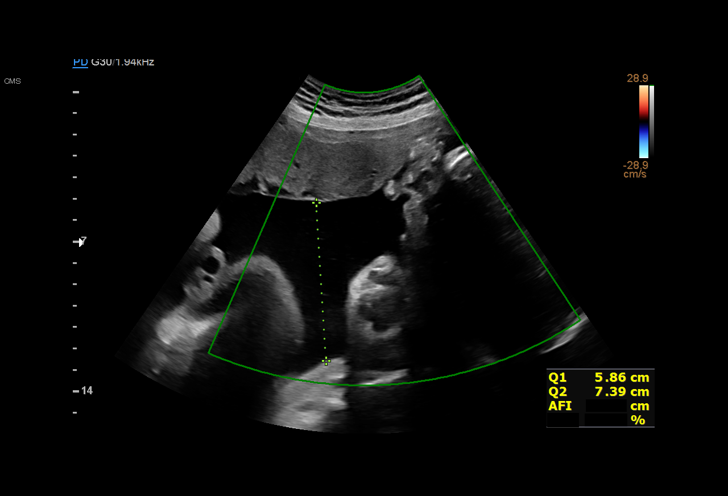
[im 27/79]
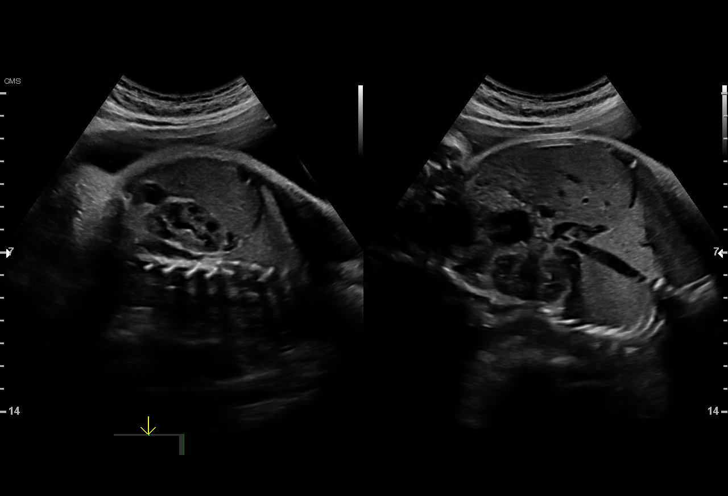
[im 32/79]
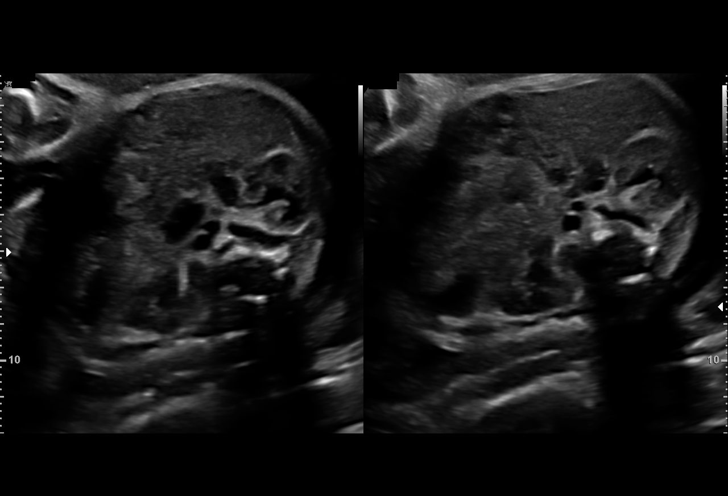
[im 41/79]
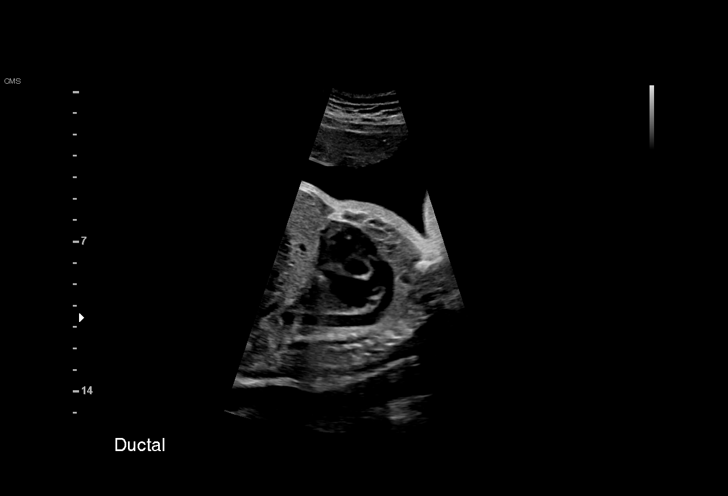
[im 47/79]
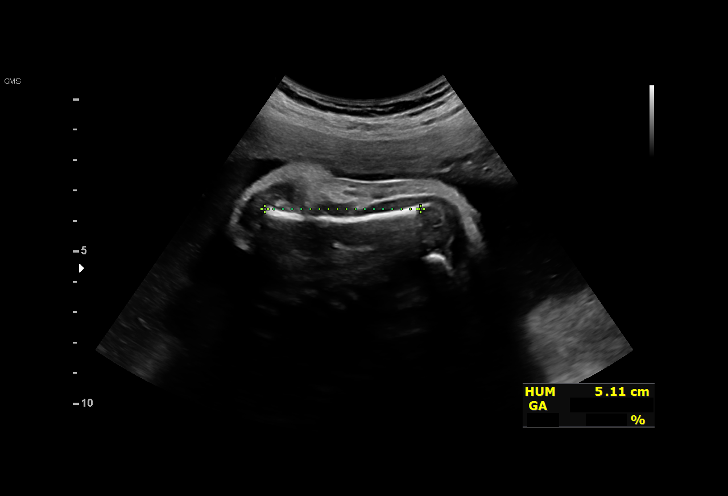
[im 53/79]
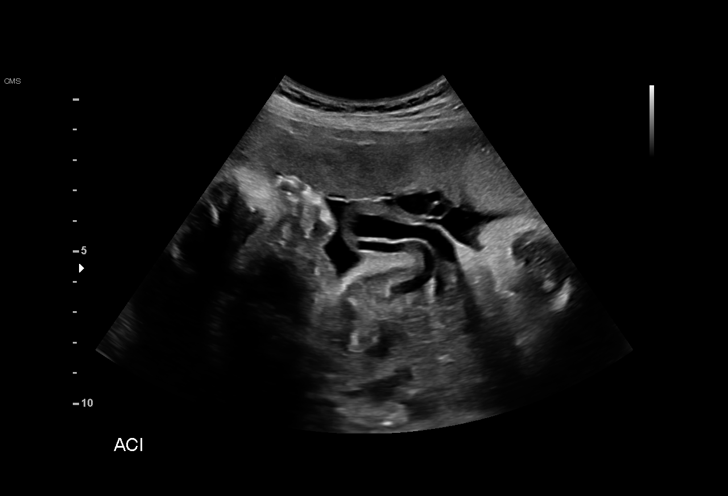
[im 58/79]
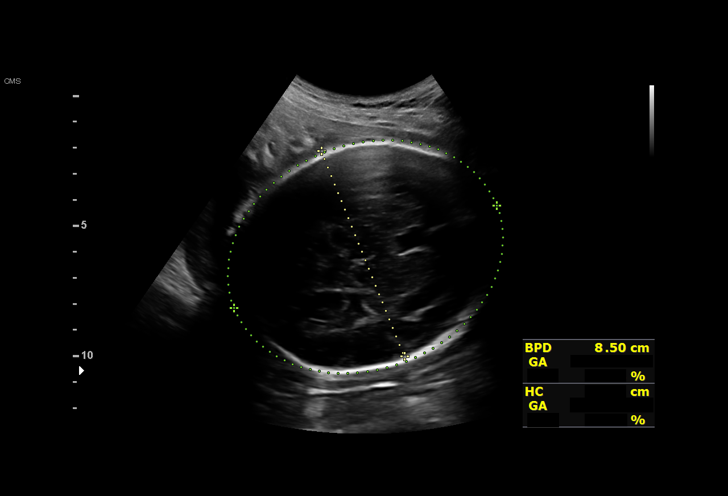
[im 64/79]
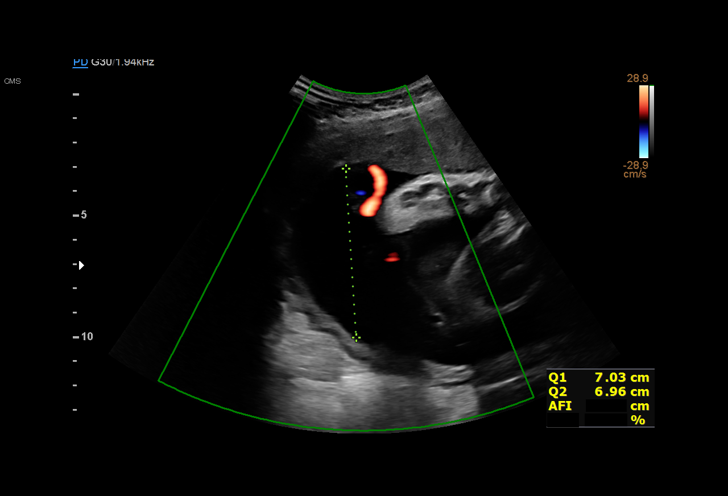
[im 70/79]
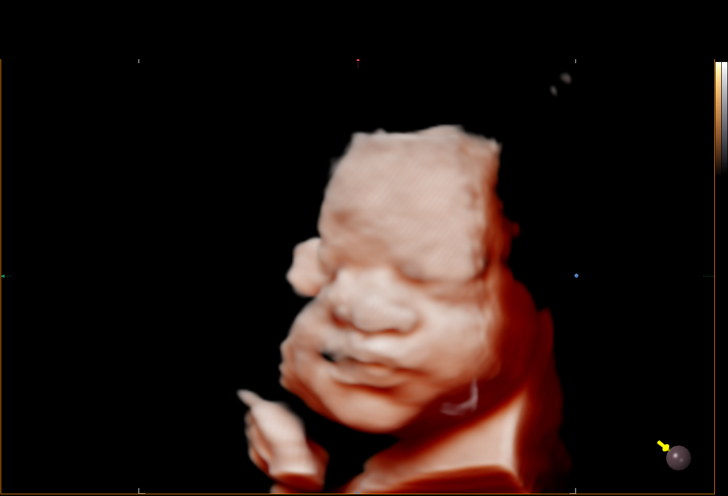
[im 76/79]
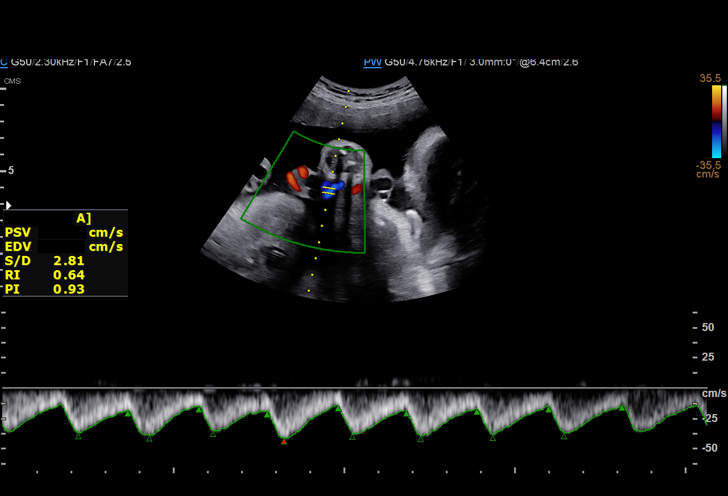

[13 of 28 positions shown; findings below may reference images not displayed]

JOHN SALIBA

Indications

 Gestational diabetes in pregnancy,
 controlled by oral hypoglycemic drugs
 Poor obstetric history: Previous preterm
 delivery, antepartum
 Fetal choroid plexus cyst (Resolved)
 (Negative Quad Screen)(Negative AFP)
 Encounter for other antenatal screening
 follow-up
 32 weeks gestation of pregnancy
Vital Signs

                                                Height:        5'
Fetal Evaluation

 Num Of Fetuses:         1
 Fetal Heart Rate(bpm):  166
 Cardiac Activity:       Observed
 Presentation:           Cephalic
 Placenta:               Anterior
 P. Cord Insertion:      Previously Visualized

 Amniotic Fluid
 AFI FV:      Subjectively upper-normal

 AFI Sum(cm)     %Tile       Largest Pocket(cm)
 26.6            97
 RUQ(cm)       RLQ(cm)       LUQ(cm)        LLQ(cm)
 6.4           7             7.2            6
Biophysical Evaluation

 Amniotic F.V:   Increased                  F. Tone:        Observed
 F. Movement:    Observed                   Score:          [DATE]
 F. Breathing:   Observed
Biometry

 BPD:      84.7  mm     G. Age:  34w 1d         84  %    CI:        76.42   %    70 - 86
                                                         FL/HC:      17.9   %    19.9 -
 HC:       307   mm     G. Age:  34w 2d         57  %    HC/AC:      1.10        0.96 -
 AC:      278.5  mm     G. Age:  31w 6d         30  %    FL/BPD:     65.1   %    71 - 87
 FL:       55.1  mm     G. Age:  29w 0d        < 1  %    FL/AC:      19.8   %    20 - 24
 HUM:      50.5  mm     G. Age:  29w 4d        < 5  %
 LV:          2  mm

 Est. FW:    4151  gm    3 lb 14 oz      12  %
OB History

 Gravidity:    4         Term:   1        Prem:   1        SAB:   1
 Living:       2
Gestational Age

 LMP:           32w 4d        Date:  09/13/19                 EDD:   06/19/20
 U/S Today:     32w 2d                                        EDD:   06/21/20
 Best:          32w 4d     Det. By:  LMP  (09/13/19)          EDD:   06/19/20
Anatomy

 Cranium:               Appears normal         LVOT:                   Appears normal
 Cavum:                 Appears normal         Aortic Arch:            Appears normal
 Ventricles:            Appears normal         Ductal Arch:            Appears normal
 Choroid Plexus:        Appears normal         Diaphragm:              Appears normal
 Cerebellum:            Previously seen        Stomach:                Appears normal, left
                                                                       sided
 Posterior Fossa:       Previously seen        Abdomen:                Previously seen
 Nuchal Fold:           Previously seen        Abdominal Wall:         Appears nml (cord
                                                                       insert, abd wall)
 Face:                  Appears normal         Cord Vessels:           Appears normal (3
                        (orbits and profile)                           vessel cord)
 Lips:                  Appears normal         Kidneys:                Appear normal
 Palate:                Profile nml; lacrimal  Bladder:                Appears normal
                        duct cyst on right
 Thoracic:              Appears normal         Spine:                  Previously seen
 Heart:                 Appears normal         Upper Extremities:      Previously seen
                        (4CH, axis, and
                        situs)
 RVOT:                  Appears normal         Lower Extremities:      Previously seen

 Other:  Female gender previously seen. Heels, 5th digit, Open hands, and
         Nasal bone previously visualized. Technically difficult due to
         advanced GA and fetal position.
Cervix Uterus Adnexa

 Cervix
 Not visualized (advanced GA >03wks)

 Uterus
 No abnormality visualized.

 Right Ovary
 Not visualized.

 Left Ovary
 Not visualized.

 Cul De Sac
 No free fluid seen.

 Adnexa
 No abnormality visualized.
Comments

 This patient was seen for a follow up growth scan due to
 gestational diabetes currently treated with Metformin.  She
 denies any problems since her last exam and reports that her
 fingerstick values have mostly been within normal limits.
 She was informed that the fetal growth appears appropriate
 for her gestational age.  Mild polyhydramnios with a total AFI
 of 26.6 cm is noted today.
 A biophysical profile performed today was [DATE].
 Due to A2 gestational diabetes, we will continue to follow her
 with weekly fetal testing.
 Another biophysical profile was scheduled in 1 week.

## 2021-03-16 IMAGING — US US MFM FETAL BPP W/O NON-STRESS
1 series · 14 of 28 positions shown · non-contrast
Comparison: none

[Series 1: us mfm fetal bpp w/o non-stress · 70 acquisitions, 14 frames shown]
[im 3/70]
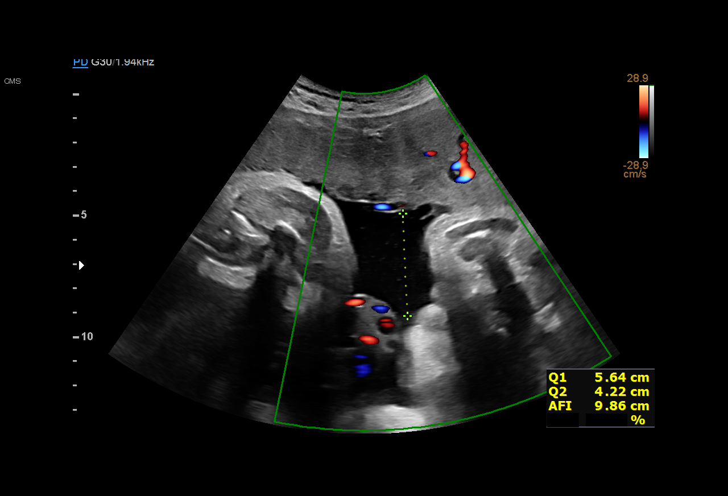
[im 8/70]
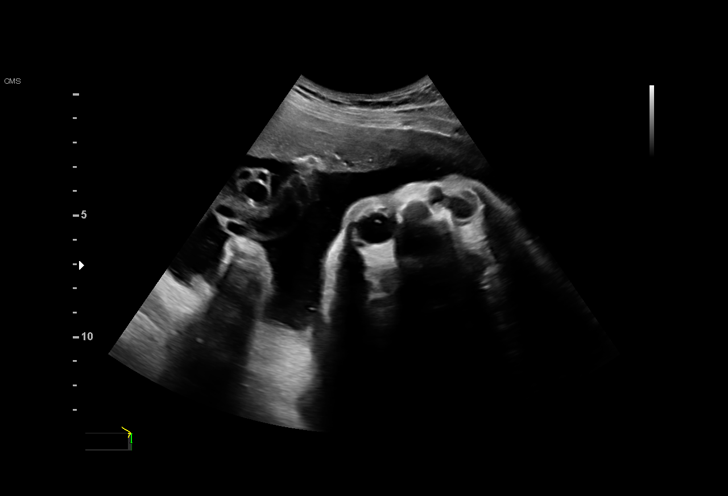
[im 13/70]
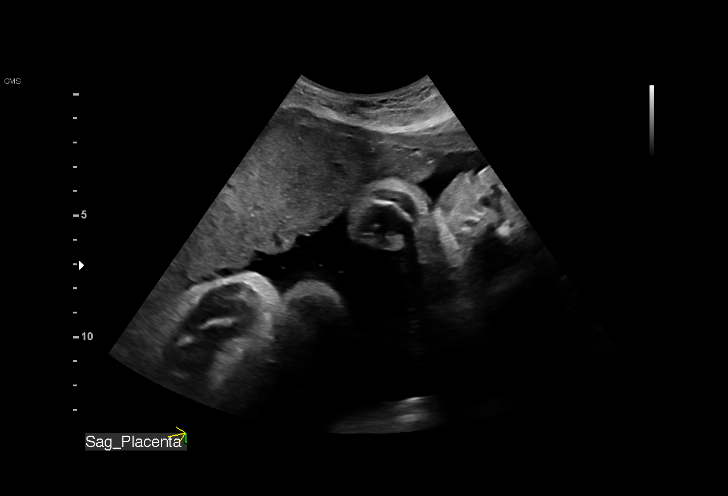
[im 18/70]
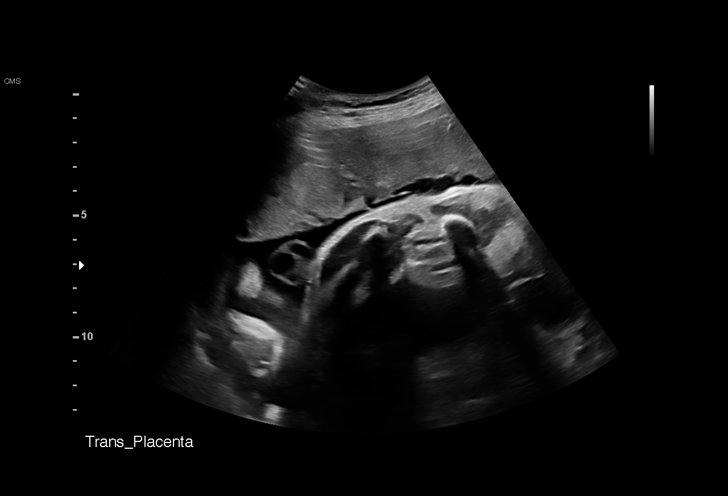
[im 24/70]
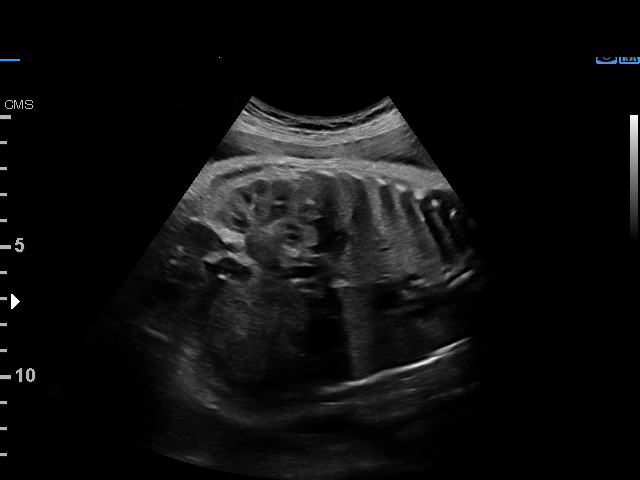
[im 29/70]
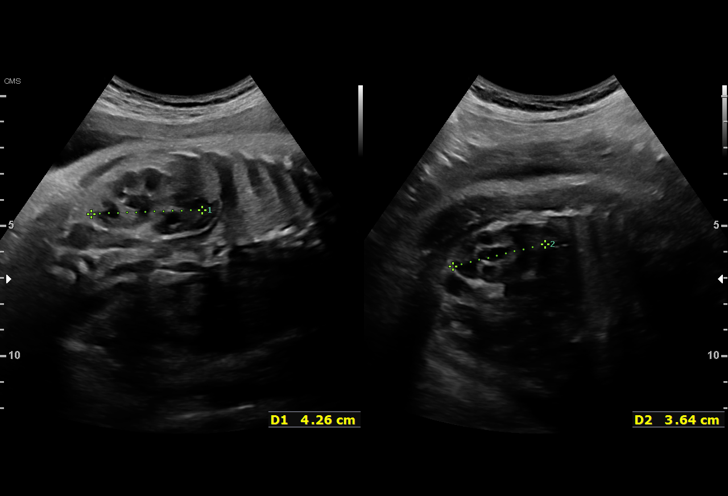
[im 34/70]
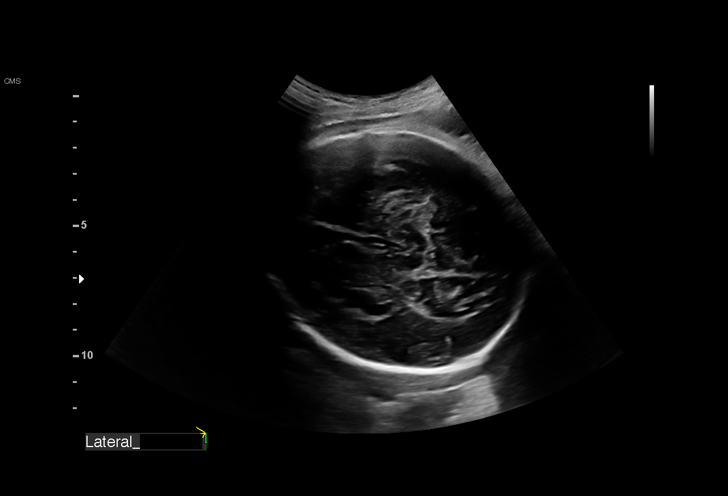
[im 39/70]
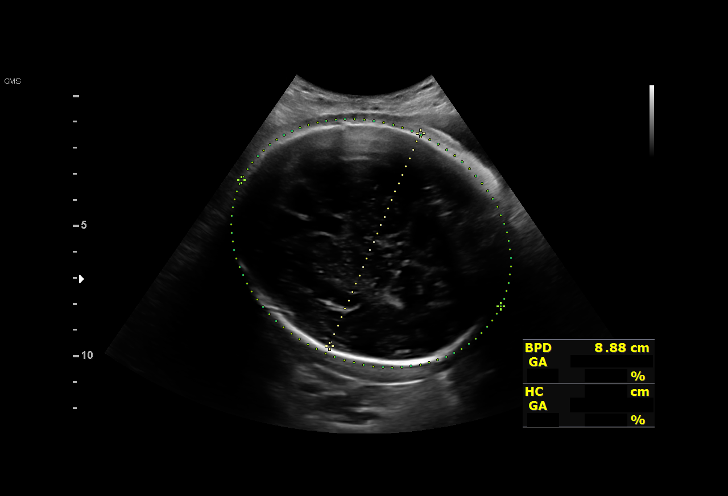
[im 44/70]
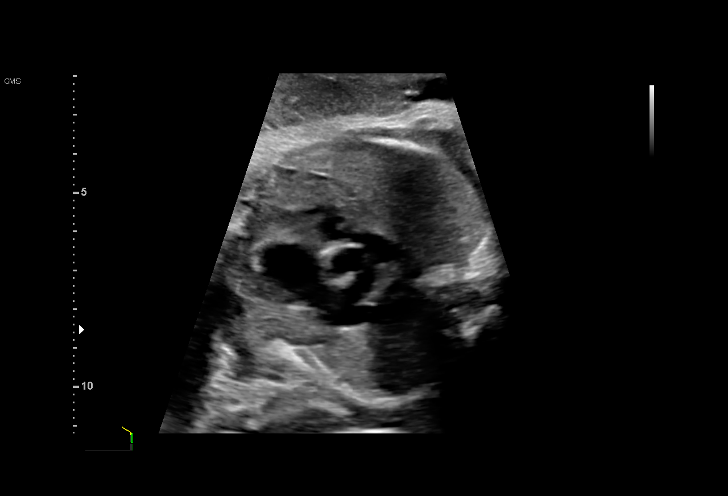
[im 49/70]
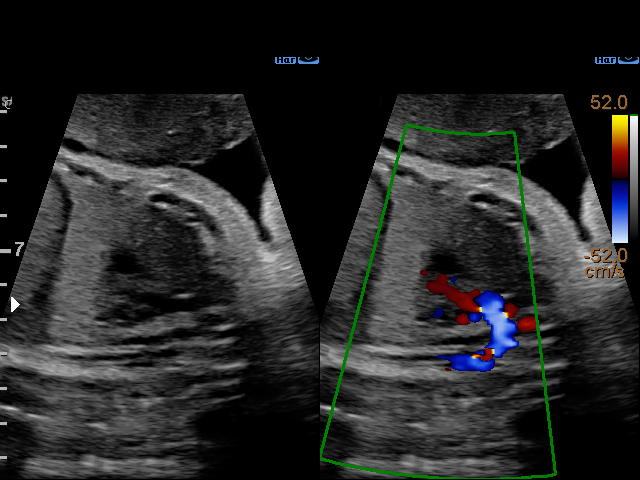
[im 54/70]
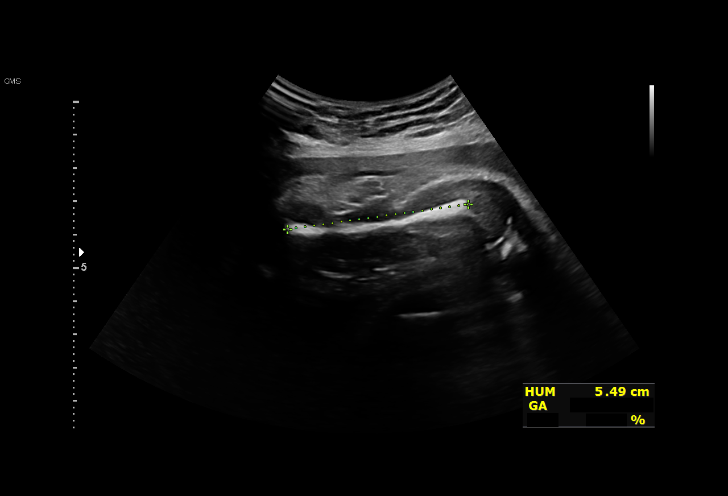
[im 59/70]
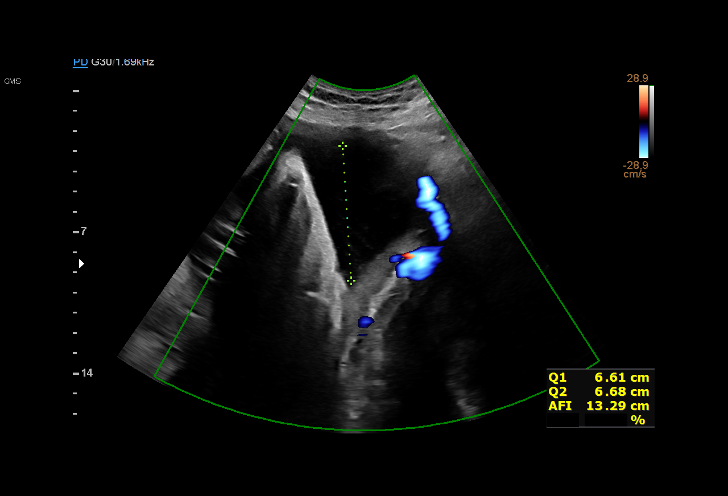
[im 64/70]
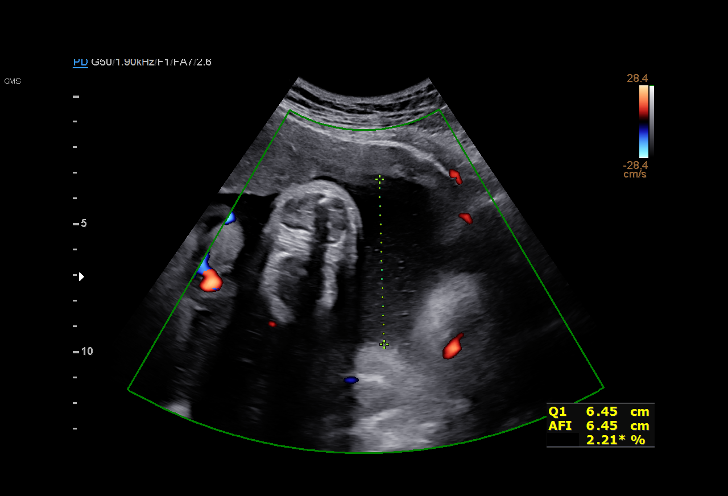
[im 70/70]
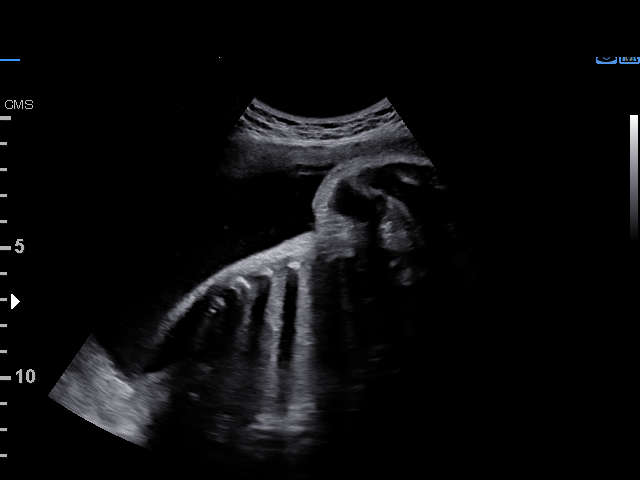

[14 of 28 positions shown; findings below may reference images not displayed]

Indications

 Encounter for other antenatal screening
 follow-up
 Gestational diabetes in pregnancy,
 controlled by oral hypoglycemic drugs
 Poor obstetric history: Previous preterm
 delivery, antepartum
 Fetal choroid plexus cyst (Resolved)
 (Negative Quad Screen)(Negative AFP)
 Polyhydramnios, third trimester, antepartum
 condition or complication, unspecified fetus
 Fetal abnormality - other known or suspect
 (lacrimal duct cyst)
 35 weeks gestation of pregnancy
Vital Signs

                                                Height:        5'
Fetal Evaluation

 Num Of Fetuses:         1
 Fetal Heart Rate(bpm):  162
 Cardiac Activity:       Observed
 Presentation:           Cephalic
 Placenta:               Anterior
 P. Cord Insertion:      Visualized
 Amniotic Fluid
 AFI FV:      Polyhydramnios

 AFI Sum(cm)     %Tile       Largest Pocket(cm)
 28.1            > 97

 RUQ(cm)       RLQ(cm)       LUQ(cm)        LLQ(cm)

Biophysical Evaluation

 Amniotic F.V:   Polyhydramnios             F. Tone:        Observed
 F. Movement:    Observed                   Score:          [DATE]
 F. Breathing:   Observed
Biometry

 BPD:      89.3  mm     G. Age:  36w 1d         74  %    CI:        77.63   %    70 - 86
                                                         FL/HC:      18.5   %    20.1 -
 HC:      320.8  mm     G. Age:  36w 1d         35  %    HC/AC:      0.98        0.93 -
 AC:      326.2  mm     G. Age:  36w 4d         85  %    FL/BPD:     66.5   %    71 - 87
 FL:       59.4  mm     G. Age:  31w 0d        < 1  %    FL/AC:      18.2   %    20 - 24
 HUM:      54.9  mm     G. Age:  32w 0d        < 5  %
 LV:        2.4  mm

 Est. FW:    8149  gm    5 lb 11 oz      37  %
OB History

 Gravidity:    4         Term:   1        Prem:   1        SAB:   1
 Living:       2
Gestational Age

 LMP:           35w 3d        Date:  09/13/19                 EDD:   06/19/20
 U/S Today:     35w 0d                                        EDD:   06/22/20
 Best:          35w 3d     Det. By:  LMP  (09/13/19)          EDD:   06/19/20
Anatomy

 Cranium:               Appears normal         Aortic Arch:            Appears normal
 Cavum:                 Appears normal         Ductal Arch:            Appears normal
 Ventricles:            Appears normal         Diaphragm:              Appears normal
 Choroid Plexus:        Appears normal         Stomach:                Appears normal, left
                                                                       sided
 Cerebellum:            Previously seen        Abdomen:                Previously seen
 Posterior Fossa:       Previously seen        Abdominal Wall:         Previously seen
 Nuchal Fold:           Previously seen        Cord Vessels:           Previously seen
 Face:                  Orb/profile normal;    Kidneys:                Appear normal
                        rt lacrimal duct cyst
 Lips:                  Appears normal         Bladder:                Appears normal
 Thoracic:              Appears normal         Spine:                  Previously seen
 Heart:                 Appears normal         Upper Extremities:      Previously seen
                        (4CH, axis, and
                        situs)
 RVOT:                  Appears normal         Lower Extremities:      Previously seen
 LVOT:                  Appears normal
 Other:  Female gender previously seen. Heels, 5th digit, open hands
         previously visualized. 3VV and 3VTV visualized. Nasal bone
         visualized.
Cervix Uterus Adnexa

 Cervix
 Not visualized (advanced GA >16wks)

 Uterus
 No abnormality visualized.

 Right Ovary
 Not visualized.

 Left Ovary
 Not visualized.

 Adnexa
 No abnormality visualized.
Impression

 Single intrauterine pregnancy here for a 46DHY with known
 lacrimal ductal cyst.
 Normal anatomy with measurements consistent with dates
 There is good fetal movement and persistent polyhydramnios.
Recommendations

 Continue weekly testing
 Plan for delivery between 37-39 weeks.

## 2021-06-10 ENCOUNTER — Encounter: Payer: Self-pay | Admitting: General Practice

## 2022-03-28 ENCOUNTER — Encounter: Payer: Self-pay | Admitting: Advanced Practice Midwife

## 2022-03-28 ENCOUNTER — Ambulatory Visit (INDEPENDENT_AMBULATORY_CARE_PROVIDER_SITE_OTHER): Payer: Medicaid Other | Admitting: Advanced Practice Midwife

## 2022-03-28 ENCOUNTER — Encounter: Payer: Self-pay | Admitting: General Practice

## 2022-03-28 ENCOUNTER — Other Ambulatory Visit (HOSPITAL_COMMUNITY)
Admission: RE | Admit: 2022-03-28 | Discharge: 2022-03-28 | Disposition: A | Discharge status: Home / Self Care | Payer: Medicaid Other | Source: Ambulatory Visit | Attending: Advanced Practice Midwife | Admitting: Advanced Practice Midwife

## 2022-03-28 VITALS — BP 120/83 | HR 110 | Wt 119.0 lb

## 2022-03-28 DIAGNOSIS — Z3480 Encounter for supervision of other normal pregnancy, unspecified trimester: Secondary | ICD-10-CM

## 2022-03-28 DIAGNOSIS — Z3A19 19 weeks gestation of pregnancy: Secondary | ICD-10-CM

## 2022-03-28 DIAGNOSIS — E119 Type 2 diabetes mellitus without complications: Secondary | ICD-10-CM

## 2022-03-28 DIAGNOSIS — O0990 Supervision of high risk pregnancy, unspecified, unspecified trimester: Secondary | ICD-10-CM | POA: Insufficient documentation

## 2022-03-28 MED ORDER — ASPIRIN 81 MG PO CHEW: 81.0000 mg | CHEWABLE_TABLET | Freq: Every day | ORAL | 4 refills | Status: DC

## 2022-03-28 NOTE — Progress Notes (Signed)
Video interpreter used [phone]

## 2022-03-28 NOTE — Assessment & Plan Note (Signed)
Will send to Nutrition for new meter and teaching (03/28/22) Baby ASA

## 2022-03-28 NOTE — Progress Notes (Addendum)
INITIAL OBSTETRICAL VISIT Patient name: Kathleen Henson MRN [id number]  Date of birth: [birth year] Chief Complaint:   New OB  Video interpretor used throughout  History of Present Illness:   Kathleen Henson is a 26 y.o. [phone] Asian female at [redacted]w[redacted]d by LMP with an Estimated Date of Delivery: 08/18/22 being seen today for her initial obstetrical visit.   Her obstetrical history is significant for  Type 2 DM (had abnormal GTT at 9 weeks) .   Today she reports no complaints.   She states she did not know she was considered Type 2 DM.  Has not been checking blood sugars because she states she was told she did not need to     03/28/2022    9:13 AM  Depression screen PHQ 2/9  Decreased Interest 0  Down, Depressed, Hopeless 0  PHQ - 2 Score 0  Altered sleeping 0  Tired, decreased energy 1  Change in appetite 0  Feeling bad or failure about yourself  0  Trouble concentrating 0  Moving slowly or fidgety/restless 0  Suicidal thoughts 0  PHQ-9 Score 1    Patient's last menstrual period was 11/11/2021. Last pap 11/18/19. Results were: normal Review of Systems:   Pertinent items are noted in HPI Denies cramping/contractions, leakage of fluid, vaginal bleeding, abnormal vaginal discharge w/ itching/odor/irritation, headaches, visual changes, shortness of breath, chest pain, abdominal pain, severe nausea/vomiting, or problems with urination or bowel movements unless otherwise stated above.  Pertinent History Reviewed:  Reviewed past medical,surgical, social, obstetrical and family history.  Reviewed problem list, medications and allergies. OB History  Gravida Para Term Preterm AB Living  5 3 2 1 1 3   SAB IAB Ectopic Multiple Live Births  1     0 3    # Outcome Date GA Lbr Len/2nd Weight Sex Delivery Anes PTL Lv  5 Current           4 Term 06/04/20 [redacted]w[redacted]d / 00:01 7 lb 2.6 oz (3.25 kg) F Vag-Spont Local  LIV  3 SAB 08/04/19          2 Term 10/22/17 [redacted]w[redacted]d    Vag-Spont  N LIV  1 Preterm 2016 [redacted]w[redacted]d  7  lb 1 oz (3.204 kg) F Vag-Spont   LIV   Physical Assessment:   Vitals:   03/28/22 0908  BP: 120/83  Pulse: (!) 110  Weight: 119 lb (54 kg)  Body mass index is 24.87 kg/m.       Physical Examination:  General appearance - well appearing, and in no distress  Mental status - alert, oriented to person, place, and time  Psych:  She has a normal mood and affect  Skin - warm and dry, normal color, no suspicious lesions noted  Chest - effort normal, all lung fields clear to auscultation bilaterally  Heart - normal rate and regular rhythm  Abdomen - soft, nontender  Extremities:  No swelling or varicosities noted  Pelvic - VULVA: normal appearing vulva with no masses, tenderness or lesions  VAGINA: normal appearing vagina with normal color and discharge, no lesions  CERVIX: normal appearing cervix without discharge or lesions, no CMT  Thin prep pap is done   Chaperone:  Kathrene Alu RN       No results found for this or any previous visit (from the past 24 hour(s)).  Assessment & Plan:  1) High-Risk Pregnancy [id number] at [redacted]w[redacted]d with an Estimated Date of Delivery: 08/18/22   2) Initial OB visit  3) ?Type 2  DM  Meds: No orders of the defined types were placed in this encounter.   Initial labs obtained Continue prenatal vitamins Reviewed n/v relief measures and warning s/s to report Reviewed recommended weight gain based on pre-gravid BMI Encouraged well-balanced diet Genetic & carrier screening discussed: requests Panorama, requests AFP Ultrasound discussed; fetal survey: requested CCNC completed> form faxed if has or is planning to apply for medicaid The nature of [facility] [employer] with multiple MDs and other Advanced Practice Providers was explained to patient; also emphasized that fellows, residents, and students are part of our team.   Indications for ASA therapy (per uptodate) One of the following: H/O preeclampsia, especially early onset/adverse  outcome No Multifetal gestation No CHTN No T1DM or T2DM Yes Chronic kidney disease No Autoimmune disease (antiphospholipid syndrome, systemic lupus erythematosus) No  Will get Hgb A1C and send her to Nutrition center for new meter and teaching          I reviewed BS goals  Follow-up: Return in about 4 weeks (around 04/25/2022) for [employer].   Orders Placed This Encounter  Procedures   Urine Culture   [place] MFM OB DETAIL +14 WK   CBC/D/Plt+RPR+Rh+ABO+RubIgG...   HgB A1c    Wynelle Bourgeois CNM, [facility] 03/28/2022 12:08 PM

## 2022-03-29 LAB — CBC/D/PLT+RPR+RH+ABO+RUBIGG...
Antibody Screen: NEGATIVE
Basophils Absolute: 0 10*3/uL (ref 0.0–0.2)
Basos: 0 %
EOS (ABSOLUTE): 0.1 10*3/uL (ref 0.0–0.4)
Eos: 1 %
HCV Ab: NONREACTIVE
HIV Screen 4th Generation wRfx: NONREACTIVE
Hematocrit: 33.8 % — ABNORMAL LOW (ref 34.0–46.6)
Hemoglobin: 11.5 g/dL (ref 11.1–15.9)
Hepatitis B Surface Ag: NEGATIVE
Immature Grans (Abs): 0 10*3/uL (ref 0.0–0.1)
Immature Granulocytes: 0 %
Lymphocytes Absolute: 2.3 10*3/uL (ref 0.7–3.1)
Lymphs: 20 %
MCH: 29.9 pg (ref 26.6–33.0)
MCHC: 34 g/dL (ref 31.5–35.7)
MCV: 88 fL (ref 79–97)
Monocytes Absolute: 0.5 10*3/uL (ref 0.1–0.9)
Monocytes: 4 %
Neutrophils Absolute: 8.6 10*3/uL — ABNORMAL HIGH (ref 1.4–7.0)
Neutrophils: 75 %
Platelets: 296 10*3/uL (ref 150–450)
RBC: 3.85 x10E6/uL (ref 3.77–5.28)
RDW: 12.8 % (ref 11.7–15.4)
RPR Ser Ql: NONREACTIVE
Rh Factor: POSITIVE
Rubella Antibodies, IGG: 2.09 index (ref >0.99)
WBC: 11.6 10*3/uL — ABNORMAL HIGH (ref 3.4–10.8)

## 2022-03-29 LAB — HEMOGLOBIN A1C
Est. average glucose Bld gHb Est-mCnc: 105 mg/dL
Hgb A1c MFr Bld: 5.3 % (ref 4.8–5.6)

## 2022-03-29 LAB — HCV INTERPRETATION

## 2022-03-31 LAB — CYTOLOGY - PAP
Chlamydia: NEGATIVE
Comment: NEGATIVE
Comment: NORMAL
Diagnosis: NEGATIVE
Neisseria Gonorrhea: NEGATIVE

## 2022-04-03 ENCOUNTER — Encounter: Payer: Self-pay | Admitting: *Deleted

## 2022-04-03 ENCOUNTER — Ambulatory Visit: Payer: Medicaid Other | Attending: Advanced Practice Midwife

## 2022-04-03 ENCOUNTER — Other Ambulatory Visit: Payer: Self-pay | Admitting: *Deleted

## 2022-04-03 ENCOUNTER — Ambulatory Visit: Payer: Medicaid Other | Admitting: *Deleted

## 2022-04-03 VITALS — BP 106/62 | HR 103

## 2022-04-03 DIAGNOSIS — O09212 Supervision of pregnancy with history of pre-term labor, second trimester: Secondary | ICD-10-CM | POA: Insufficient documentation

## 2022-04-03 DIAGNOSIS — Z3480 Encounter for supervision of other normal pregnancy, unspecified trimester: Secondary | ICD-10-CM | POA: Insufficient documentation

## 2022-04-03 DIAGNOSIS — O09292 Supervision of pregnancy with other poor reproductive or obstetric history, second trimester: Secondary | ICD-10-CM | POA: Insufficient documentation

## 2022-04-03 DIAGNOSIS — Z3A20 20 weeks gestation of pregnancy: Secondary | ICD-10-CM | POA: Insufficient documentation

## 2022-04-03 DIAGNOSIS — O24112 Pre-existing diabetes mellitus, type 2, in pregnancy, second trimester: Secondary | ICD-10-CM | POA: Diagnosis present

## 2022-04-03 DIAGNOSIS — O0990 Supervision of high risk pregnancy, unspecified, unspecified trimester: Secondary | ICD-10-CM

## 2022-04-03 DIAGNOSIS — O0932 Supervision of pregnancy with insufficient antenatal care, second trimester: Secondary | ICD-10-CM | POA: Diagnosis not present

## 2022-04-03 DIAGNOSIS — O09219 Supervision of pregnancy with history of pre-term labor, unspecified trimester: Secondary | ICD-10-CM

## 2022-04-28 ENCOUNTER — Ambulatory Visit (INDEPENDENT_AMBULATORY_CARE_PROVIDER_SITE_OTHER): Payer: Medicaid Other | Admitting: Family Medicine

## 2022-04-28 VITALS — BP 107/63 | HR 99 | Wt 123.0 lb

## 2022-04-28 DIAGNOSIS — Z3A24 24 weeks gestation of pregnancy: Secondary | ICD-10-CM

## 2022-04-28 DIAGNOSIS — O0930 Supervision of pregnancy with insufficient antenatal care, unspecified trimester: Secondary | ICD-10-CM | POA: Insufficient documentation

## 2022-04-28 DIAGNOSIS — O0990 Supervision of high risk pregnancy, unspecified, unspecified trimester: Secondary | ICD-10-CM

## 2022-04-28 DIAGNOSIS — Z8632 Personal history of gestational diabetes: Secondary | ICD-10-CM

## 2022-04-28 DIAGNOSIS — O0932 Supervision of pregnancy with insufficient antenatal care, second trimester: Secondary | ICD-10-CM

## 2022-04-28 DIAGNOSIS — O0992 Supervision of high risk pregnancy, unspecified, second trimester: Secondary | ICD-10-CM

## 2022-04-28 DIAGNOSIS — Z789 Other specified health status: Secondary | ICD-10-CM

## 2022-04-28 NOTE — Progress Notes (Signed)
Interpreter [phone] audio call used for visit. Kathrene Alu RN

## 2022-04-28 NOTE — Progress Notes (Signed)
   PRENATAL VISIT NOTE  Subjective:  Kathleen Henson is a 26 y.o. [id number] at [redacted]w[redacted]d being seen today for ongoing prenatal care.  She is currently monitored for the following issues for this low-risk pregnancy and has Language barrier affecting health care; History of preterm delivery; History of diet controlled gestational diabetes mellitus (GDM); Supervision of high risk pregnancy, antepartum; and Late prenatal care affecting pregnancy, antepartum on their problem list.  Patient reports no complaints.  Contractions: Not present. Vag. Bleeding: None.  Movement: Present. Denies leaking of fluid.   The following portions of the patient's history were reviewed and updated as appropriate: allergies, current medications, past family history, past medical history, past social history, past surgical history and problem list.   Objective:   Vitals:   04/28/22 0812  BP: 107/63  Pulse: 99  Weight: 123 lb (55.8 kg)    Fetal Status: Fetal Heart Rate (bpm): 145   Movement: Present     General:  Alert, oriented and cooperative. Patient is in no acute distress.  Skin: Skin is warm and dry. No rash noted.   Cardiovascular: Normal heart rate noted  Respiratory: Normal respiratory effort, no problems with respiration noted  Abdomen: Soft, gravid, appropriate for gestational age.  Pain/Pressure: Absent     Pelvic: Cervical exam deferred        Extremities: Normal range of motion.  Edema: None  Mental Status: Normal mood and affect. Normal behavior. Normal judgment and thought content.   Assessment and Plan:  Pregnancy: [id number] at [redacted]w[redacted]d 1. [redacted] weeks gestation of pregnancy 2. Supervision of high risk pregnancy, antepartum FHT and FH normal  3. History of diet controlled gestational diabetes mellitus (GDM) Patient was erroneously marked as having Type 2 diabetes. She had an elevated 1 hr GTT at 9 weeks in her previous pregnancy. However, her HgA1c was 5.4 - well below the threshold for T2DM. Additionally, her  HgA1c 4 weeks ago was 5.3, which is a normal value.  Patient needs 2 hr GTT next appointment  4. Language barrier affecting health care Interpreter used  5. Late prenatal care affecting pregnancy, antepartum   Preterm labor symptoms and general obstetric precautions including but not limited to vaginal bleeding, contractions, leaking of fluid and fetal movement were reviewed in detail with the patient. Please refer to After Visit Summary for other counseling recommendations.   No follow-ups on file.  Future Appointments  Date Time Provider [place]  05/02/2022  9:15 AM [facility] NURSE [facility]  05/02/2022  9:30 AM [facility] US2 [facility]  05/23/2022  9:35 AM Seabron Spates, CNM [facility] None    Truett Mainland, DO

## 2022-05-02 ENCOUNTER — Ambulatory Visit: Payer: Medicaid Other | Attending: Obstetrics

## 2022-05-02 ENCOUNTER — Other Ambulatory Visit: Payer: Self-pay | Admitting: *Deleted

## 2022-05-02 ENCOUNTER — Ambulatory Visit: Payer: Medicaid Other | Admitting: *Deleted

## 2022-05-02 VITALS — BP 108/74 | HR 102

## 2022-05-02 DIAGNOSIS — Z362 Encounter for other antenatal screening follow-up: Secondary | ICD-10-CM

## 2022-05-02 DIAGNOSIS — E119 Type 2 diabetes mellitus without complications: Secondary | ICD-10-CM | POA: Diagnosis not present

## 2022-05-02 DIAGNOSIS — O0932 Supervision of pregnancy with insufficient antenatal care, second trimester: Secondary | ICD-10-CM | POA: Diagnosis not present

## 2022-05-02 DIAGNOSIS — O24112 Pre-existing diabetes mellitus, type 2, in pregnancy, second trimester: Secondary | ICD-10-CM

## 2022-05-02 DIAGNOSIS — O0990 Supervision of high risk pregnancy, unspecified, unspecified trimester: Secondary | ICD-10-CM

## 2022-05-02 DIAGNOSIS — O09292 Supervision of pregnancy with other poor reproductive or obstetric history, second trimester: Secondary | ICD-10-CM | POA: Diagnosis not present

## 2022-05-02 DIAGNOSIS — Z3A24 24 weeks gestation of pregnancy: Secondary | ICD-10-CM

## 2022-05-23 ENCOUNTER — Ambulatory Visit (INDEPENDENT_AMBULATORY_CARE_PROVIDER_SITE_OTHER): Payer: Medicaid Other | Admitting: Advanced Practice Midwife

## 2022-05-23 ENCOUNTER — Encounter: Payer: Self-pay | Admitting: Advanced Practice Midwife

## 2022-05-23 VITALS — BP 105/68 | HR 95 | Wt 130.0 lb

## 2022-05-23 DIAGNOSIS — O0990 Supervision of high risk pregnancy, unspecified, unspecified trimester: Secondary | ICD-10-CM

## 2022-05-23 DIAGNOSIS — Z789 Other specified health status: Secondary | ICD-10-CM

## 2022-05-23 DIAGNOSIS — O0992 Supervision of high risk pregnancy, unspecified, second trimester: Secondary | ICD-10-CM

## 2022-05-23 DIAGNOSIS — Z8632 Personal history of gestational diabetes: Secondary | ICD-10-CM

## 2022-05-23 DIAGNOSIS — Z3A27 27 weeks gestation of pregnancy: Secondary | ICD-10-CM

## 2022-05-23 MED ORDER — CYCLOBENZAPRINE HCL 5 MG PO TABS: 5.0000 mg | ORAL_TABLET | Freq: Three times a day (TID) | ORAL | 0 refills | Status: DC | PRN

## 2022-05-23 NOTE — Progress Notes (Signed)
AMN language service interpreter Dah [phone].

## 2022-05-23 NOTE — Progress Notes (Signed)
   PRENATAL VISIT NOTE  Subjective:  Kathleen Henson is a 26 y.o. [id number] at [redacted]w[redacted]d being seen today for ongoing prenatal care.  She is currently monitored for the following issues for this high-risk pregnancy and has Language barrier affecting health care; History of preterm delivery; History of diet controlled gestational diabetes mellitus (GDM); Supervision of high risk pregnancy, antepartum; and Late prenatal care affecting pregnancy, antepartum on their problem list.  Patient reports backache.  Contractions: Not present. Vag. Bleeding: None.  Movement: Present. Denies leaking of fluid.   The following portions of the patient's history were reviewed and updated as appropriate: allergies, current medications, past family history, past medical history, past social history, past surgical history and problem list.   Objective:   Vitals:   05/23/22 0820  BP: 105/68  Pulse: 95  Weight: 130 lb (59 kg)    Fetal Status: Fetal Heart Rate (bpm): 137 Fundal Height: 27 cm Movement: Present     General:  Alert, oriented and cooperative. Patient is in no acute distress.  Skin: Skin is warm and dry. No rash noted.   Cardiovascular: Normal heart rate noted  Respiratory: Normal respiratory effort, no problems with respiration noted  Abdomen: Soft, gravid, appropriate for gestational age.  Pain/Pressure: Absent     Pelvic: Cervical exam deferred        Extremities: Normal range of motion.  Edema: None  Mental Status: Normal mood and affect. Normal behavior. Normal judgment and thought content.   Assessment and Plan:  Pregnancy: [id number] at [redacted]w[redacted]d 1. [redacted] weeks gestation of pregnancy      Some back pain at times  Will Rx Flexeril prn back pain, warned about somnolence side effect - CBC - Glucose Tolerance, 2 Hours w/1 Hour - HIV Antibody (routine testing w rflx) - RPR  2. Supervision of high risk pregnancy, antepartum      Glucola today      Info given on TDAP and Flu shots in her language  3.  History of diet controlled gestational diabetes mellitus (GDM)      Glucola today  4. Language barrier affecting health care     AMN interpretor used throughout  Preterm labor symptoms and general obstetric precautions including but not limited to vaginal bleeding, contractions, leaking of fluid and fetal movement were reviewed in detail with the patient. Please refer to After Visit Summary for other counseling recommendations.   Return in about 2 weeks (around 06/06/2022) for [employer].  Future Appointments  Date Time Provider Department Center  05/23/2022  9:35 AM Aviva Signs, CNM [facility] None  05/31/2022 12:30 PM [facility] NURSE [facility]  05/31/2022 12:45 PM [facility] US4 [facility]  06/08/2022  2:10 PM Levie Heritage, DO [facility] None  06/22/2022 10:55 AM Constant, Gigi Gin, MD [facility] None  06/28/2022 12:30 PM [facility] NURSE [facility]  06/28/2022 12:45 PM [facility] US4 [facility]  07/05/2022 12:30 PM [facility] NURSE [facility]  07/05/2022 12:45 PM [facility] US4 [facility]  07/12/2022 10:30 AM [facility] NURSE [facility]  07/12/2022 10:45 AM [facility] US5 [facility]  07/19/2022 10:30 AM [facility] NURSE [facility]  07/19/2022 10:45 AM [facility] US5 [facility]    Wynelle Bourgeois, CNM

## 2022-05-24 LAB — CBC
Hematocrit: 35.6 % (ref 34.0–46.6)
Hemoglobin: 12.1 g/dL (ref 11.1–15.9)
MCH: 29.7 pg (ref 26.6–33.0)
MCHC: 34 g/dL (ref 31.5–35.7)
MCV: 87 fL (ref 79–97)
Platelets: 303 10*3/uL (ref 150–450)
RBC: 4.08 x10E6/uL (ref 3.77–5.28)
RDW: 11.6 % — ABNORMAL LOW (ref 11.7–15.4)
WBC: 11.2 10*3/uL — ABNORMAL HIGH (ref 3.4–10.8)

## 2022-05-24 LAB — RPR: RPR Ser Ql: NONREACTIVE

## 2022-05-24 LAB — GLUCOSE TOLERANCE, 2 HOURS W/ 1HR
Glucose, 1 hour: 212 mg/dL — ABNORMAL HIGH (ref 70–179)
Glucose, 2 hour: 195 mg/dL — ABNORMAL HIGH (ref 70–152)
Glucose, Fasting: 74 mg/dL (ref 70–91)

## 2022-05-24 LAB — HIV ANTIBODY (ROUTINE TESTING W REFLEX): HIV Screen 4th Generation wRfx: NONREACTIVE

## 2022-05-30 ENCOUNTER — Other Ambulatory Visit: Payer: Self-pay

## 2022-05-30 DIAGNOSIS — Z789 Other specified health status: Secondary | ICD-10-CM

## 2022-05-30 DIAGNOSIS — O99810 Abnormal glucose complicating pregnancy: Secondary | ICD-10-CM

## 2022-05-31 ENCOUNTER — Ambulatory Visit: Payer: Medicaid Other | Attending: Maternal & Fetal Medicine

## 2022-05-31 ENCOUNTER — Ambulatory Visit: Payer: Medicaid Other | Admitting: *Deleted

## 2022-05-31 VITALS — BP 103/64 | HR 95

## 2022-05-31 DIAGNOSIS — O0990 Supervision of high risk pregnancy, unspecified, unspecified trimester: Secondary | ICD-10-CM | POA: Diagnosis present

## 2022-05-31 DIAGNOSIS — O09213 Supervision of pregnancy with history of pre-term labor, third trimester: Secondary | ICD-10-CM | POA: Diagnosis not present

## 2022-05-31 DIAGNOSIS — E119 Type 2 diabetes mellitus without complications: Secondary | ICD-10-CM

## 2022-05-31 DIAGNOSIS — O24113 Pre-existing diabetes mellitus, type 2, in pregnancy, third trimester: Secondary | ICD-10-CM | POA: Diagnosis not present

## 2022-05-31 DIAGNOSIS — Z3A28 28 weeks gestation of pregnancy: Secondary | ICD-10-CM | POA: Diagnosis not present

## 2022-05-31 DIAGNOSIS — O24112 Pre-existing diabetes mellitus, type 2, in pregnancy, second trimester: Secondary | ICD-10-CM

## 2022-06-08 ENCOUNTER — Ambulatory Visit (INDEPENDENT_AMBULATORY_CARE_PROVIDER_SITE_OTHER): Payer: Medicaid Other | Admitting: Family Medicine

## 2022-06-08 VITALS — BP 116/62 | HR 101 | Wt 132.0 lb

## 2022-06-08 DIAGNOSIS — Z3A29 29 weeks gestation of pregnancy: Secondary | ICD-10-CM

## 2022-06-08 DIAGNOSIS — Z789 Other specified health status: Secondary | ICD-10-CM

## 2022-06-08 DIAGNOSIS — O0992 Supervision of high risk pregnancy, unspecified, second trimester: Secondary | ICD-10-CM

## 2022-06-08 DIAGNOSIS — Z8751 Personal history of pre-term labor: Secondary | ICD-10-CM

## 2022-06-08 DIAGNOSIS — O0990 Supervision of high risk pregnancy, unspecified, unspecified trimester: Secondary | ICD-10-CM

## 2022-06-08 DIAGNOSIS — O24419 Gestational diabetes mellitus in pregnancy, unspecified control: Secondary | ICD-10-CM

## 2022-06-08 MED ORDER — ACCU-CHEK GUIDE VI STRP: ORAL_STRIP | 12 refills | Status: DC

## 2022-06-08 MED ORDER — BLOOD GLUCOSE MONITOR KIT: PACK | 0 refills | Status: DC

## 2022-06-08 MED ORDER — ACCU-CHEK SOFTCLIX LANCETS MISC: 1.0000 | Freq: Four times a day (QID) | 12 refills | Status: DC

## 2022-06-08 NOTE — Progress Notes (Signed)
Interpreter [phone]

## 2022-06-08 NOTE — Progress Notes (Signed)
   PRENATAL VISIT NOTE  Subjective:  Kathleen Henson is a 26 y.o. K5G2563 at 19w6dbeing seen today for ongoing prenatal care.  She is currently monitored for the following issues for this high-risk pregnancy and has Language barrier affecting health care; History of preterm delivery; History of diet controlled gestational diabetes mellitus (GDM); Supervision of high risk pregnancy, antepartum; and Late prenatal care affecting pregnancy, antepartum on their problem list.  Patient reports no complaints.  Contractions: Not present. Vag. Bleeding: None.  Movement: Present. Denies leaking of fluid.   The following portions of the patient's history were reviewed and updated as appropriate: allergies, current medications, past family history, past medical history, past social history, past surgical history and problem list.   Objective:   Vitals:   06/08/22 1404  BP: 116/62  Pulse: (!) 101  Weight: 132 lb (59.9 kg)    Fetal Status: Fetal Heart Rate (bpm): 130   Movement: Present     General:  Alert, oriented and cooperative. Patient is in no acute distress.  Skin: Skin is warm and dry. No rash noted.   Cardiovascular: Normal heart rate noted  Respiratory: Normal respiratory effort, no problems with respiration noted  Abdomen: Soft, gravid, appropriate for gestational age.  Pain/Pressure: Absent     Pelvic: Cervical exam deferred        Extremities: Normal range of motion.     Mental Status: Normal mood and affect. Normal behavior. Normal judgment and thought content.   Assessment and Plan:  Pregnancy: [id number] 245w6d. Supervision of high risk pregnancy, antepartum  2. [redacted] weeks gestation of pregnancy  3. Language barrier affecting health care Interpreter used  4. History of preterm delivery No signs of preterm labor  5. Gestational diabetes mellitus (GDM) in third trimester, gestational diabetes method of control unspecified Patient unaware of diagnosis. Testing supplies prescribed.  Had GDM in prior pregnancy.   - Accu-Chek Softclix Lancets lancets; 1 each by Other route 4 (four) times daily. Use as instructed  Dispense: 100 each; Refill: 12 - blood glucose meter kit and supplies KIT; Dispense based on patient and insurance preference. Use up to four times daily as directed.  Dispense: 1 each; Refill: 0 - glucose blood (ACCU-CHEK GUIDE) test strip; Use as instructed  Dispense: 100 each; Refill: 12  Preterm labor symptoms and general obstetric precautions including but not limited to vaginal bleeding, contractions, leaking of fluid and fetal movement were reviewed in detail with the patient. Please refer to After Visit Summary for other counseling recommendations.   No follow-ups on file.  Future Appointments  Date Time Provider [place]12/21/2023 10:55 AM Constant, PeVickii ChafeMD [facility] None  06/28/2022 12:30 PM [facility]/27/2023 12:45 PM [facility]/09/2022 12:30 PM [facility]/09/2022 12:45 PM [facility]/10/2022  2:10 PM FoInez CatalinaMD [facility] None  07/12/2022 10:30 AM [facility]/04/2023 10:45 AM [facility]/17/2024 10:30 AM [facility]SE [facility]/17/2024 10:45 AM [facility]/18/2024  2:10 PM StTruett MainlandDO [facility] None  07/27/2022  2:30 PM StTruett MainlandDO [facility] None  08/03/2022 11:15 AM StTruett MainlandDO [facility] None    JaTruett MainlandDO

## 2022-06-15 ENCOUNTER — Other Ambulatory Visit: Payer: Medicaid Other

## 2022-06-22 ENCOUNTER — Ambulatory Visit (INDEPENDENT_AMBULATORY_CARE_PROVIDER_SITE_OTHER): Payer: Medicaid Other | Admitting: Obstetrics and Gynecology

## 2022-06-22 ENCOUNTER — Encounter: Payer: Self-pay | Admitting: Obstetrics and Gynecology

## 2022-06-22 VITALS — BP 109/62 | HR 107 | Wt 133.0 lb

## 2022-06-22 DIAGNOSIS — Z23 Encounter for immunization: Secondary | ICD-10-CM

## 2022-06-22 DIAGNOSIS — O0993 Supervision of high risk pregnancy, unspecified, third trimester: Secondary | ICD-10-CM

## 2022-06-22 DIAGNOSIS — Z8751 Personal history of pre-term labor: Secondary | ICD-10-CM

## 2022-06-22 DIAGNOSIS — Z3A31 31 weeks gestation of pregnancy: Secondary | ICD-10-CM

## 2022-06-22 DIAGNOSIS — Z8632 Personal history of gestational diabetes: Secondary | ICD-10-CM

## 2022-06-22 DIAGNOSIS — Z789 Other specified health status: Secondary | ICD-10-CM

## 2022-06-22 DIAGNOSIS — O0990 Supervision of high risk pregnancy, unspecified, unspecified trimester: Secondary | ICD-10-CM

## 2022-06-22 NOTE — Progress Notes (Signed)
   PRENATAL VISIT NOTE  Subjective:  Kathleen Henson is a 26 y.o. [id number] at [redacted]w[redacted]d being seen today for ongoing prenatal care.  She is currently monitored for the following issues for this high-risk pregnancy and has Language barrier affecting health care; History of preterm delivery; History of diet controlled gestational diabetes mellitus (GDM); Supervision of high risk pregnancy, antepartum; and Late prenatal care affecting pregnancy, antepartum on their problem list.  Patient reports no complaints.  Contractions: Irritability. Vag. Bleeding: None.  Movement: Present. Denies leaking of fluid.   The following portions of the patient's history were reviewed and updated as appropriate: allergies, current medications, past family history, past medical history, past social history, past surgical history and problem list.   Objective:   Vitals:   06/22/22 1044  BP: 109/62  Pulse: (!) 107  Weight: 133 lb (60.3 kg)    Fetal Status: Fetal Heart Rate (bpm): 143 Fundal Height: 32 cm Movement: Present     General:  Alert, oriented and cooperative. Patient is in no acute distress.  Skin: Skin is warm and dry. No rash noted.   Cardiovascular: Normal heart rate noted  Respiratory: Normal respiratory effort, no problems with respiration noted  Abdomen: Soft, gravid, appropriate for gestational age.  Pain/Pressure: Absent     Pelvic: Cervical exam deferred        Extremities: Normal range of motion.  Edema: None  Mental Status: Normal mood and affect. Normal behavior. Normal judgment and thought content.   Assessment and Plan:  Pregnancy: [id number] at [redacted]w[redacted]d 1. [redacted] weeks gestation of pregnancy   2. Supervision of high risk pregnancy, antepartum Patient is doing well without complaints  3. History of diet controlled gestational diabetes mellitus (GDM) CBGs reviewed and all within range Continue diet control and encouraged her to continue her efforts Follow up growth ultrasound next week  4. History  of preterm delivery   5. Language barrier affecting health care Clydie Braun interpreter used  Preterm labor symptoms and general obstetric precautions including but not limited to vaginal bleeding, contractions, leaking of fluid and fetal movement were reviewed in detail with the patient. Please refer to After Visit Summary for other counseling recommendations.   Return in about 2 weeks (around 07/06/2022) for in person, ROB, High risk.  Future Appointments  Date Time Provider Department Center  06/28/2022 12:30 PM [facility] NURSE [facility]  06/28/2022 12:45 PM [facility] US4 [facility]  07/05/2022 12:30 PM [facility] NURSE [facility]  07/05/2022 12:45 PM [facility] US4 [facility]  07/06/2022  2:10 PM Lennart Pall, MD [facility] None  07/12/2022 10:30 AM [facility] NURSE [facility]  07/12/2022 10:45 AM [facility] US5 [facility]  07/19/2022 10:30 AM [facility] NURSE [facility]  07/19/2022 10:45 AM [facility] US5 [facility]  07/20/2022  2:10 PM Levie Heritage, DO [facility] None  07/27/2022  2:30 PM Levie Heritage, DO [facility] None  08/03/2022 11:15 AM Stinson, Rhona Raider, DO [facility] None    Catalina Antigua, MD

## 2022-06-22 NOTE — Progress Notes (Signed)
Amn language services interpreter Naw [phone].

## 2022-06-28 ENCOUNTER — Ambulatory Visit: Payer: Medicaid Other | Admitting: *Deleted

## 2022-06-28 ENCOUNTER — Ambulatory Visit: Payer: Medicaid Other | Attending: Maternal & Fetal Medicine

## 2022-06-28 ENCOUNTER — Other Ambulatory Visit: Payer: Self-pay | Admitting: *Deleted

## 2022-06-28 VITALS — BP 106/63 | HR 114

## 2022-06-28 DIAGNOSIS — Z3A32 32 weeks gestation of pregnancy: Secondary | ICD-10-CM

## 2022-06-28 DIAGNOSIS — O24419 Gestational diabetes mellitus in pregnancy, unspecified control: Secondary | ICD-10-CM

## 2022-06-28 DIAGNOSIS — O09213 Supervision of pregnancy with history of pre-term labor, third trimester: Secondary | ICD-10-CM

## 2022-06-28 DIAGNOSIS — O24410 Gestational diabetes mellitus in pregnancy, diet controlled: Secondary | ICD-10-CM | POA: Diagnosis not present

## 2022-06-28 DIAGNOSIS — O09293 Supervision of pregnancy with other poor reproductive or obstetric history, third trimester: Secondary | ICD-10-CM

## 2022-06-28 DIAGNOSIS — O24112 Pre-existing diabetes mellitus, type 2, in pregnancy, second trimester: Secondary | ICD-10-CM | POA: Diagnosis present

## 2022-06-28 DIAGNOSIS — O0933 Supervision of pregnancy with insufficient antenatal care, third trimester: Secondary | ICD-10-CM | POA: Diagnosis not present

## 2022-06-28 DIAGNOSIS — O0990 Supervision of high risk pregnancy, unspecified, unspecified trimester: Secondary | ICD-10-CM

## 2022-06-28 DIAGNOSIS — O09899 Supervision of other high risk pregnancies, unspecified trimester: Secondary | ICD-10-CM

## 2022-06-28 NOTE — Progress Notes (Incomplete)
Opened in error

## 2022-07-03 NOTE — L&D Delivery Note (Addendum)
OB/GYN Faculty Practice Delivery Note  Kathleen Henson is a 27 y.o. [id number] s/p VD at [redacted]w[redacted]d. She was admitted for SOL.   ROM: 0h 70m with clear fluid GBS Status: Negative/-- (01/25 1442) Maximum Maternal Temperature: 97.5  Labor Progress: Initial SVE: 6.5/80/0. She then progressed to complete.   Delivery Date/Time: 08/07/2022 @ 0654 Delivery: Called to room and patient was complete and pushing. Head delivered LOA. No nuchal cord present. Shoulder and body delivered in usual fashion. Infant with spontaneous cry, placed on mother's abdomen, dried and stimulated. Cord clamped x 2 after 1-minute delay, and cut by FOB. Cord blood drawn. Placenta delivered spontaneously with gentle cord traction. Fundus firm with massage and Pitocin. Labia, perineum, vagina, and cervix inspected with 1st degree perineal.  Baby Weight: pending  Placenta: 3 vessel, intact. Sent to L&D Complications: None Lacerations: 1st degree perineal repaired  EBL: 87 mL Analgesia: Lidocaine   Infant:  APGAR (1 MIN): 9   APGAR (5 MINS): 9    Gifford Shave, MD  OB Fellow  08/07/2022, 7:16 AM

## 2022-07-05 ENCOUNTER — Ambulatory Visit: Payer: Medicaid Other

## 2022-07-06 ENCOUNTER — Ambulatory Visit (INDEPENDENT_AMBULATORY_CARE_PROVIDER_SITE_OTHER): Payer: Medicaid Other | Admitting: Obstetrics and Gynecology

## 2022-07-06 VITALS — BP 102/66 | HR 104 | Wt 136.0 lb

## 2022-07-06 DIAGNOSIS — Z789 Other specified health status: Secondary | ICD-10-CM

## 2022-07-06 DIAGNOSIS — O0990 Supervision of high risk pregnancy, unspecified, unspecified trimester: Secondary | ICD-10-CM

## 2022-07-06 DIAGNOSIS — O0993 Supervision of high risk pregnancy, unspecified, third trimester: Secondary | ICD-10-CM

## 2022-07-06 DIAGNOSIS — Z3A33 33 weeks gestation of pregnancy: Secondary | ICD-10-CM

## 2022-07-06 DIAGNOSIS — O24410 Gestational diabetes mellitus in pregnancy, diet controlled: Secondary | ICD-10-CM

## 2022-07-06 DIAGNOSIS — Z8751 Personal history of pre-term labor: Secondary | ICD-10-CM

## 2022-07-06 LAB — POCT URINALYSIS DIPSTICK OB
Bilirubin, UA: NEGATIVE
Blood, UA: NEGATIVE
Glucose, UA: NEGATIVE
Ketones, UA: NEGATIVE
Nitrite, UA: NEGATIVE
Spec Grav, UA: 1.015 (ref 1.010–1.025)
Urobilinogen, UA: 0.2 E.U./dL
pH, UA: 7 (ref 5.0–8.0)

## 2022-07-06 NOTE — Progress Notes (Signed)
AMN language services interpreter Hta [phone]. Wilberto Console l Shivaun Bilello, CMA

## 2022-07-06 NOTE — Progress Notes (Signed)
   PRENATAL VISIT NOTE  Subjective:  Kathleen Henson is a 27 y.o. [id number] at [redacted]w[redacted]d being seen today for ongoing prenatal care.  She is currently monitored for the following issues for this high-risk pregnancy and has Language barrier affecting health care; History of preterm delivery; GDM (gestational diabetes mellitus), class A1; Supervision of high risk pregnancy, antepartum; and Late prenatal care affecting pregnancy, antepartum on their problem list.  Patient reports backache.  Contractions: Not present. Vag. Bleeding: None.  Movement: Present. Denies leaking of fluid.   The following portions of the patient's history were reviewed and updated as appropriate: allergies, current medications, past family history, past medical history, past social history, past surgical history and problem list.   Objective:   Vitals:   07/06/22 1415  BP: 102/66  Pulse: (!) 104  Weight: 136 lb (61.7 kg)    Fetal Status: Fetal Heart Rate (bpm): 151   Movement: Present     General:  Alert, oriented and cooperative. Patient is in no acute distress.  Skin: Skin is warm and dry. No rash noted.   Cardiovascular: Normal heart rate noted  Respiratory: Normal respiratory effort, no problems with respiration noted  Abdomen: Soft, gravid, appropriate for gestational age.  Pain/Pressure: Absent      Assessment and Plan:  Pregnancy: [id number] at [redacted]w[redacted]d 1. Supervision of high risk pregnancy, antepartum 2. [redacted] weeks gestation of pregnancy - Discussed strategies for management of back pain - Discussed NIPS never collected - pt declines - Reviewed GBS next visit - POC Urinalysis Dipstick OB  3. GDM (gestational diabetes mellitus), class A1 BG reviewed - 2/13 elevated fasting and 4/13 elevated postprandial across all meals. Discussed possibility of medication initiation if more elevated BG next appt. She will work on diet for now. @32 /5: 2307g (78%) - next growth 1/24  4. History of preterm delivery No si/sxs PTL  5.  Language barrier affecting health care Gala Murdoch & counseled with Santiago Glad virtual interpreter  Preterm labor symptoms and general obstetric precautions including but not limited to vaginal bleeding, contractions, leaking of fluid and fetal movement were reviewed in detail with the patient.  Please refer to After Visit Summary for other counseling recommendations.   No follow-ups on file.  Future Appointments  Date Time Provider [place]  07/20/2022  2:10 PM Truett Mainland, DO [facility] None  07/26/2022  8:15 AM [facility] NURSE [facility]  07/26/2022  8:30 AM [facility] US3 [facility]  07/27/2022  2:30 PM Truett Mainland, DO [facility] None  08/03/2022 11:15 AM Truett Mainland, DO [facility] None  08/09/2022 11:15 AM Truett Mainland, DO [facility] None  08/16/2022  2:10 PM Lavonia Drafts, MD [facility] None   Inez Catalina, MD

## 2022-07-09 ENCOUNTER — Inpatient Hospital Stay (HOSPITAL_COMMUNITY)
Admission: AD | Admit: 2022-07-09 | Discharge: 2022-07-09 | Disposition: A | Discharge status: Home / Self Care | Payer: Medicaid Other | Attending: Obstetrics & Gynecology | Admitting: Obstetrics & Gynecology

## 2022-07-09 ENCOUNTER — Other Ambulatory Visit: Payer: Self-pay

## 2022-07-09 ENCOUNTER — Encounter (HOSPITAL_COMMUNITY): Payer: Self-pay | Admitting: Obstetrics & Gynecology

## 2022-07-09 DIAGNOSIS — Z3A34 34 weeks gestation of pregnancy: Secondary | ICD-10-CM | POA: Insufficient documentation

## 2022-07-09 DIAGNOSIS — O479 False labor, unspecified: Secondary | ICD-10-CM | POA: Diagnosis not present

## 2022-07-09 DIAGNOSIS — O24113 Pre-existing diabetes mellitus, type 2, in pregnancy, third trimester: Secondary | ICD-10-CM | POA: Insufficient documentation

## 2022-07-09 DIAGNOSIS — O4703 False labor before 37 completed weeks of gestation, third trimester: Secondary | ICD-10-CM | POA: Diagnosis not present

## 2022-07-09 LAB — URINALYSIS, ROUTINE W REFLEX MICROSCOPIC
Bilirubin Urine: NEGATIVE
Glucose, UA: NEGATIVE mg/dL
Hgb urine dipstick: NEGATIVE
Ketones, ur: NEGATIVE mg/dL
Nitrite: NEGATIVE
Protein, ur: NEGATIVE mg/dL
Specific Gravity, Urine: 1.008 (ref 1.005–1.030)
pH: 8 (ref 5.0–8.0)

## 2022-07-09 NOTE — MAU Note (Signed)
.  Kathleen Henson is a 27 y.o. at [redacted]w[redacted]d here in MAU reporting: ctx/cramping q 10 min since 0400.  Denies LOF or vag bleeding. +FM   Onset of complaint: 0400 Pain score: 8/10 Vitals:   07/09/22 0906  BP: 101/67  Pulse: 81  Resp: 17  Temp: 98.2 F (36.8 C)  SpO2: 99%     FHT:140 Lab orders placed from triage:

## 2022-07-09 NOTE — MAU Provider Note (Signed)
History     [id number]  Arrival date and time: 07/09/22 1324    Chief Complaint  Patient presents with   Contractions     HPI Kathleen Henson is a 27 y.o. at [redacted]w[redacted]d who presents for contractions.  Reports contractions every 10 minutes since 4 AM this morning.   Denies leaking of fluid or vaginal bleeding.  Reports good fetal movement.  OB History     Gravida  5   Para  3   Term  2   Preterm  1   AB  1   Living  3      SAB  1   IAB      Ectopic      Multiple  0   Live Births  3           Past Medical History:  Diagnosis Date   Diabetes mellitus without complication ([place])    Type 2    Past Surgical History:  Procedure Laterality Date   NO PAST SURGERIES      Family History  Problem Relation Age of Onset   Cancer Neg Hx    Hypertension Neg Hx    Diabetes Neg Hx     No Known Allergies  No current facility-administered medications on file prior to encounter.   Current Outpatient Medications on File Prior to Encounter  Medication Sig Dispense Refill   Accu-Chek Softclix Lancets lancets 1 each by Other route 4 (four) times daily. Use as instructed 100 each 12   aspirin 81 MG chewable tablet Chew 1 tablet (81 mg total) by mouth daily. (Patient not taking: Reported on 04/03/2022) 30 tablet 4   blood glucose meter kit and supplies KIT Dispense based on patient and insurance preference. Use up to four times daily as directed. 1 each 0   cyclobenzaprine (FLEXERIL) 5 MG tablet Take 1 tablet (5 mg total) by mouth every 8 (eight) hours as needed for muscle spasms (back pain). (Patient not taking: Reported on 05/31/2022) 30 tablet 0   glucose blood (ACCU-CHEK GUIDE) test strip Use as instructed 100 each 12   Prenatal Vit-Fe Fumarate-FA (PRENATAL MULTIVITAMIN) TABS tablet Take 1 tablet by mouth daily at 12 noon. (Patient not taking: Reported on 03/28/2022) 30 tablet 10     ROS Pertinent positives and negative per HPI, all others reviewed and  negative  Physical Exam   BP 101/67   Pulse 81   Temp 98.2 F (36.8 C)   Resp 17   LMP 11/11/2021   SpO2 99%   Patient Vitals for the past 24 hrs:  BP Temp Pulse Resp SpO2  07/09/22 0906 101/67 98.2 F (36.8 C) 81 17 99 %    Physical Exam Vitals and nursing note reviewed. Exam conducted with a chaperone present.  Constitutional:      General: She is not in acute distress.    Appearance: Normal appearance.  HENT:     Head: Normocephalic and atraumatic.  Eyes:     General: No scleral icterus.    Conjunctiva/sclera: Conjunctivae normal.  Pulmonary:     Effort: Pulmonary effort is normal. No respiratory distress.  Abdominal:     Palpations: Abdomen is soft.     Tenderness: There is no abdominal tenderness.  Skin:    General: Skin is warm and dry.  Neurological:     Mental Status: She is alert.  Psychiatric:        Mood and Affect: Mood normal.  Behavior: Behavior normal.      Cervical Exam Dilation: 1.5 Effacement (%): Thick Cervical Position: Posterior Station: Ballotable Exam by:: Taresa [place] np  FHT Baseline 135, moderate variability, 15x15 accels, no decels Toco: Q 2-10 minutes Cat: 1  Labs Results for orders placed or performed during the hospital encounter of 07/09/22 (from the past 24 hour(s))  Urinalysis, Routine w reflex microscopic Urine, Clean Catch     Status: Abnormal   Collection Time: 07/09/22  9:54 AM  Result Value Ref Range   Color, Urine YELLOW YELLOW   APPearance CLEAR CLEAR   Specific Gravity, Urine 1.008 1.005 - 1.030   pH 8.0 5.0 - 8.0   Glucose, UA NEGATIVE NEGATIVE mg/dL   Hgb urine dipstick NEGATIVE NEGATIVE   Bilirubin Urine NEGATIVE NEGATIVE   Ketones, ur NEGATIVE NEGATIVE mg/dL   Protein, ur NEGATIVE NEGATIVE mg/dL   Nitrite NEGATIVE NEGATIVE   Leukocytes,Ua TRACE (A) NEGATIVE   RBC / HPF 0-5 0 - 5 RBC/hpf   WBC, UA 0-5 0 - 5 WBC/hpf   Bacteria, UA RARE (A) NONE SEEN   Squamous Epithelial / HPF 0-5 0 - 5 /HPF    Mucus PRESENT     Imaging No results found.  MAU Course  Procedures Lab Orders         Culture, OB Urine         Urinalysis, Routine w reflex microscopic Urine, Clean Catch     No orders of the defined types were placed in this encounter.  Imaging Orders  No imaging studies ordered today    MDM Cervix 1.5/thick/ballotable & unchanged after 1+ hour of monitoring. Patient reports that contractions have decreased since being in MAU Assessment and Plan   1. Braxton Hick's contraction   2. [redacted] weeks gestation of pregnancy    -Reviewed preterm labor precautions & reasons to return to MAU -Urine culture pending  *Phone Clydie Braun interpreter used for this encounter*  Judeth Horn, NP 07/09/22 11:04 AM

## 2022-07-11 LAB — CULTURE, OB URINE
Culture: <10000 — AB
Special Requests: NORMAL

## 2022-07-12 ENCOUNTER — Ambulatory Visit: Payer: Medicaid Other

## 2022-07-19 ENCOUNTER — Ambulatory Visit: Payer: Medicaid Other

## 2022-07-20 ENCOUNTER — Encounter: Payer: Medicaid Other | Admitting: Family Medicine

## 2022-07-26 ENCOUNTER — Ambulatory Visit: Payer: Medicaid Other | Attending: Obstetrics and Gynecology

## 2022-07-26 ENCOUNTER — Ambulatory Visit: Payer: Medicaid Other | Admitting: *Deleted

## 2022-07-26 VITALS — BP 100/62 | HR 89

## 2022-07-26 DIAGNOSIS — O0990 Supervision of high risk pregnancy, unspecified, unspecified trimester: Secondary | ICD-10-CM | POA: Diagnosis present

## 2022-07-26 DIAGNOSIS — Z3A36 36 weeks gestation of pregnancy: Secondary | ICD-10-CM

## 2022-07-26 DIAGNOSIS — O09293 Supervision of pregnancy with other poor reproductive or obstetric history, third trimester: Secondary | ICD-10-CM | POA: Diagnosis not present

## 2022-07-26 DIAGNOSIS — O24410 Gestational diabetes mellitus in pregnancy, diet controlled: Secondary | ICD-10-CM | POA: Diagnosis not present

## 2022-07-26 DIAGNOSIS — O09899 Supervision of other high risk pregnancies, unspecified trimester: Secondary | ICD-10-CM | POA: Diagnosis present

## 2022-07-26 DIAGNOSIS — O24419 Gestational diabetes mellitus in pregnancy, unspecified control: Secondary | ICD-10-CM | POA: Diagnosis present

## 2022-07-26 DIAGNOSIS — O09213 Supervision of pregnancy with history of pre-term labor, third trimester: Secondary | ICD-10-CM

## 2022-07-26 DIAGNOSIS — O0933 Supervision of pregnancy with insufficient antenatal care, third trimester: Secondary | ICD-10-CM | POA: Diagnosis not present

## 2022-07-27 ENCOUNTER — Other Ambulatory Visit (HOSPITAL_COMMUNITY)
Admission: RE | Admit: 2022-07-27 | Discharge: 2022-07-27 | Disposition: A | Discharge status: Home / Self Care | Payer: Medicaid Other | Source: Ambulatory Visit | Attending: Family Medicine | Admitting: Family Medicine

## 2022-07-27 ENCOUNTER — Ambulatory Visit (INDEPENDENT_AMBULATORY_CARE_PROVIDER_SITE_OTHER): Payer: Medicaid Other | Admitting: Family Medicine

## 2022-07-27 VITALS — BP 107/83 | HR 108 | Wt 137.0 lb

## 2022-07-27 DIAGNOSIS — Z3A36 36 weeks gestation of pregnancy: Secondary | ICD-10-CM

## 2022-07-27 DIAGNOSIS — Z789 Other specified health status: Secondary | ICD-10-CM

## 2022-07-27 DIAGNOSIS — O24419 Gestational diabetes mellitus in pregnancy, unspecified control: Secondary | ICD-10-CM

## 2022-07-27 DIAGNOSIS — Z8751 Personal history of pre-term labor: Secondary | ICD-10-CM

## 2022-07-27 DIAGNOSIS — O0990 Supervision of high risk pregnancy, unspecified, unspecified trimester: Secondary | ICD-10-CM | POA: Diagnosis not present

## 2022-07-27 DIAGNOSIS — O24410 Gestational diabetes mellitus in pregnancy, diet controlled: Secondary | ICD-10-CM

## 2022-07-27 MED ORDER — ACCU-CHEK SOFTCLIX LANCETS MISC: 1.0000 | Freq: Four times a day (QID) | 12 refills | Status: DC

## 2022-07-27 NOTE — Progress Notes (Signed)
Video Interpreter [id number] used for visit. Kathrene Alu RN

## 2022-07-27 NOTE — Progress Notes (Signed)
   PRENATAL VISIT NOTE  Subjective:  Kathleen Henson is a 27 y.o. [id number] at [redacted]w[redacted]d being seen today for ongoing prenatal care.  She is currently monitored for the following issues for this high-risk pregnancy and has Language barrier affecting health care; History of preterm delivery; GDM (gestational diabetes mellitus), class A1; Supervision of high risk pregnancy, antepartum; and Late prenatal care affecting pregnancy, antepartum on their problem list.  Patient reports no complaints.  Contractions: Irritability. Vag. Bleeding: None.  Movement: Present. Denies leaking of fluid.   The following portions of the patient's history were reviewed and updated as appropriate: allergies, current medications, past family history, past medical history, past social history, past surgical history and problem list.   Objective:   Vitals:   07/27/22 1436  BP: 107/83  Pulse: (!) 108  Weight: 137 lb (62.1 kg)    Fetal Status:   Fundal Height: 37 cm Movement: Present  Presentation: Vertex  General:  Alert, oriented and cooperative. Patient is in no acute distress.  Skin: Skin is warm and dry. No rash noted.   Cardiovascular: Normal heart rate noted  Respiratory: Normal respiratory effort, no problems with respiration noted  Abdomen: Soft, gravid, appropriate for gestational age.  Pain/Pressure: Absent     Pelvic: Cervical exam performed in the presence of a chaperone Dilation: 3 Effacement (%): 70 Station: -2  Extremities: Normal range of motion.  Edema: None  Mental Status: Normal mood and affect. Normal behavior. Normal judgment and thought content.   Assessment and Plan:  Pregnancy: [id number] at [redacted]w[redacted]d 1. [redacted] weeks gestation of pregnancy - GC/Chlamydia probe amp ([place])not at [facility] - Culture, beta strep (group b only)  2. Language barrier affecting health care Interpreter used - GC/Chlamydia probe amp ([place])not at [facility] - Culture, beta strep (group b only)  3. Supervision of high risk  pregnancy, antepartum - GC/Chlamydia probe amp ([place])not at [facility] - Culture, beta strep (group b only)  4. History of preterm delivery  5. GDM (gestational diabetes mellitus), class A1 Induction scheduled for 40 weeks CBGs fairly well controlled. Some postprandials elevated  6. Gestational diabetes mellitus (GDM) in third trimester, gestational diabetes method of control unspecified - Accu-Chek Softclix Lancets lancets; 1 each by Other route 4 (four) times daily. Use as instructed  Dispense: 100 each; Refill: 12  Preterm labor symptoms and general obstetric precautions including but not limited to vaginal bleeding, contractions, leaking of fluid and fetal movement were reviewed in detail with the patient. Please refer to After Visit Summary for other counseling recommendations.   No follow-ups on file.  Future Appointments  Date Time Provider [place]  08/03/2022 11:15 AM Truett Mainland, DO [facility] None  08/09/2022 11:15 AM Truett Mainland, DO [facility] None  08/16/2022  2:10 PM Lavonia Drafts, MD [facility] None    Truett Mainland, DO

## 2022-07-27 NOTE — Addendum Note (Signed)
Addended by: Truett Mainland on: 07/27/2022 03:22 PM   Modules accepted: Orders

## 2022-07-28 ENCOUNTER — Other Ambulatory Visit (HOSPITAL_COMMUNITY): Payer: Self-pay | Admitting: Advanced Practice Midwife

## 2022-07-31 LAB — GC/CHLAMYDIA PROBE AMP (~~LOC~~) NOT AT ARMC
Chlamydia: NEGATIVE
Comment: NEGATIVE
Comment: NORMAL
Neisseria Gonorrhea: NEGATIVE

## 2022-07-31 LAB — CULTURE, BETA STREP (GROUP B ONLY): Strep Gp B Culture: NEGATIVE

## 2022-08-03 ENCOUNTER — Ambulatory Visit (INDEPENDENT_AMBULATORY_CARE_PROVIDER_SITE_OTHER): Payer: Medicaid Other | Admitting: Family Medicine

## 2022-08-03 VITALS — BP 115/75 | HR 104 | Wt 140.0 lb

## 2022-08-03 DIAGNOSIS — Z3A37 37 weeks gestation of pregnancy: Secondary | ICD-10-CM

## 2022-08-03 DIAGNOSIS — O0990 Supervision of high risk pregnancy, unspecified, unspecified trimester: Secondary | ICD-10-CM

## 2022-08-03 DIAGNOSIS — Z789 Other specified health status: Secondary | ICD-10-CM

## 2022-08-03 DIAGNOSIS — O24410 Gestational diabetes mellitus in pregnancy, diet controlled: Secondary | ICD-10-CM

## 2022-08-03 DIAGNOSIS — Z8751 Personal history of pre-term labor: Secondary | ICD-10-CM

## 2022-08-03 NOTE — Progress Notes (Signed)
   PRENATAL VISIT NOTE  Subjective:  Kathleen Henson is a 27 y.o. [id number] at [redacted]w[redacted]d being seen today for ongoing prenatal care.  She is currently monitored for the following issues for this high-risk pregnancy and has Language barrier affecting health care; History of preterm delivery; GDM (gestational diabetes mellitus), class A1; Supervision of high risk pregnancy, antepartum; and Late prenatal care affecting pregnancy, antepartum on their problem list.  Patient reports no complaints.  Contractions: Not present. Vag. Bleeding: None.  Movement: Present. Denies leaking of fluid.   The following portions of the patient's history were reviewed and updated as appropriate: allergies, current medications, past family history, past medical history, past social history, past surgical history and problem list.   Objective:   Vitals:   08/03/22 1113  BP: 115/75  Pulse: (!) 104  Weight: 140 lb (63.5 kg)    Fetal Status: Fetal Heart Rate (bpm): 140   Movement: Present     General:  Alert, oriented and cooperative. Patient is in no acute distress.  Skin: Skin is warm and dry. No rash noted.   Cardiovascular: Normal heart rate noted  Respiratory: Normal respiratory effort, no problems with respiration noted  Abdomen: Soft, gravid, appropriate for gestational age.  Pain/Pressure: Present     Pelvic: Cervical exam deferred        Extremities: Normal range of motion.  Edema: None  Mental Status: Normal mood and affect. Normal behavior. Normal judgment and thought content.   Assessment and Plan:  Pregnancy: [id number] at [redacted]w[redacted]d 1. Supervision of high risk pregnancy, antepartum FHT and FH normal  2. GDM (gestational diabetes mellitus), class A1 MFM now recommending delivery of A1 GDM at 2 weeks - will adjust induction date.  3. History of preterm delivery  4. Language barrier affecting health care Interpreter used   Term labor symptoms and general obstetric precautions including but not limited to  vaginal bleeding, contractions, leaking of fluid and fetal movement were reviewed in detail with the patient. Please refer to After Visit Summary for other counseling recommendations.   No follow-ups on file.  Future Appointments  Date Time Provider [place]  08/09/2022 11:15 AM Truett Mainland, DO [facility] None  08/12/2022  6:45 AM [facility] SCHED ROOM [facility] None  09/21/2022  9:35 AM Truett Mainland, DO [facility] None    Truett Mainland, DO

## 2022-08-07 ENCOUNTER — Encounter (HOSPITAL_COMMUNITY): Payer: Self-pay | Admitting: Family Medicine

## 2022-08-07 ENCOUNTER — Other Ambulatory Visit: Payer: Self-pay

## 2022-08-07 ENCOUNTER — Inpatient Hospital Stay (HOSPITAL_COMMUNITY)
Admission: AD | Admit: 2022-08-07 | Discharge: 2022-08-08 | DRG: 807 | Disposition: A | Discharge status: Home / Self Care | Payer: Medicaid Other | Attending: Family Medicine | Admitting: Family Medicine

## 2022-08-07 DIAGNOSIS — O24420 Gestational diabetes mellitus in childbirth, diet controlled: Secondary | ICD-10-CM | POA: Diagnosis present

## 2022-08-07 DIAGNOSIS — O0930 Supervision of pregnancy with insufficient antenatal care, unspecified trimester: Secondary | ICD-10-CM

## 2022-08-07 DIAGNOSIS — O0990 Supervision of high risk pregnancy, unspecified, unspecified trimester: Secondary | ICD-10-CM

## 2022-08-07 DIAGNOSIS — O24410 Gestational diabetes mellitus in pregnancy, diet controlled: Secondary | ICD-10-CM | POA: Diagnosis present

## 2022-08-07 DIAGNOSIS — O26893 Other specified pregnancy related conditions, third trimester: Secondary | ICD-10-CM | POA: Diagnosis present

## 2022-08-07 DIAGNOSIS — Z3A38 38 weeks gestation of pregnancy: Secondary | ICD-10-CM | POA: Diagnosis not present

## 2022-08-07 DIAGNOSIS — Z3490 Encounter for supervision of normal pregnancy, unspecified, unspecified trimester: Secondary | ICD-10-CM | POA: Diagnosis present

## 2022-08-07 DIAGNOSIS — Z30017 Encounter for initial prescription of implantable subdermal contraceptive: Secondary | ICD-10-CM

## 2022-08-07 DIAGNOSIS — Z603 Acculturation difficulty: Secondary | ICD-10-CM | POA: Diagnosis present

## 2022-08-07 DIAGNOSIS — Z789 Other specified health status: Secondary | ICD-10-CM | POA: Diagnosis present

## 2022-08-07 LAB — CBC
HCT: 37.6 % (ref 36.0–46.0)
Hemoglobin: 12.5 g/dL (ref 12.0–15.0)
MCH: 26.9 pg (ref 26.0–34.0)
MCHC: 33.2 g/dL (ref 30.0–36.0)
MCV: 81 fL (ref 80.0–100.0)
Platelets: 376 10*3/uL (ref 150–400)
RBC: 4.64 MIL/uL (ref 3.87–5.11)
RDW: 13.4 % (ref 11.5–15.5)
WBC: 12.5 10*3/uL — ABNORMAL HIGH (ref 4.0–10.5)
nRBC: 0 % (ref 0.0–0.2)

## 2022-08-07 LAB — TYPE AND SCREEN
ABO/RH(D): O POS
Antibody Screen: NEGATIVE

## 2022-08-07 LAB — RPR: RPR Ser Ql: NONREACTIVE

## 2022-08-07 LAB — GLUCOSE, CAPILLARY: Glucose-Capillary: 97 mg/dL (ref 70–99)

## 2022-08-07 MED ORDER — COCONUT OIL OIL: 1.0000 | TOPICAL_OIL | Status: DC | PRN

## 2022-08-07 MED ORDER — TERBUTALINE SULFATE 1 MG/ML IJ SOLN: 0.2500 mg | Freq: Once | INTRAMUSCULAR | Status: DC | PRN

## 2022-08-07 MED ORDER — ACETAMINOPHEN 325 MG PO TABS: 650.0000 mg | ORAL_TABLET | ORAL | Status: DC | PRN

## 2022-08-07 MED ORDER — SODIUM CHLORIDE 0.9% FLUSH: 3.0000 mL | INTRAVENOUS | Status: DC | PRN

## 2022-08-07 MED ORDER — ONDANSETRON HCL 4 MG/2ML IJ SOLN: 4.0000 mg | Freq: Four times a day (QID) | INTRAMUSCULAR | Status: DC | PRN

## 2022-08-07 MED ORDER — WITCH HAZEL-GLYCERIN EX PADS: 1.0000 | MEDICATED_PAD | CUTANEOUS | Status: DC | PRN

## 2022-08-07 MED ORDER — DIBUCAINE (PERIANAL) 1 % EX OINT: 1.0000 | TOPICAL_OINTMENT | CUTANEOUS | Status: DC | PRN

## 2022-08-07 MED ORDER — SIMETHICONE 80 MG PO CHEW: 80.0000 mg | CHEWABLE_TABLET | ORAL | Status: DC | PRN

## 2022-08-07 MED ORDER — SODIUM CHLORIDE 0.9% FLUSH
3.0000 mL | Freq: Two times a day (BID) | INTRAVENOUS | Status: DC
  Administered 2022-08-07: 3 mL via INTRAVENOUS

## 2022-08-07 MED ORDER — BENZOCAINE-MENTHOL 20-0.5 % EX AERO: 1.0000 | INHALATION_SPRAY | CUTANEOUS | Status: DC | PRN

## 2022-08-07 MED ORDER — OXYTOCIN-SODIUM CHLORIDE 30-0.9 UT/500ML-% IV SOLN
2.5000 [IU]/h | INTRAVENOUS | Status: DC
  Administered 2022-08-07: 2.5 [IU]/h via INTRAVENOUS
  Filled 2022-08-07: qty 500

## 2022-08-07 MED ORDER — IBUPROFEN 600 MG PO TABS
600.0000 mg | ORAL_TABLET | Freq: Four times a day (QID) | ORAL | Status: DC
  Administered 2022-08-07 – 2022-08-08 (×5): 600 mg via ORAL
  Filled 2022-08-07 (×5): qty 1

## 2022-08-07 MED ORDER — ONDANSETRON HCL 4 MG PO TABS: 4.0000 mg | ORAL_TABLET | ORAL | Status: DC | PRN

## 2022-08-07 MED ORDER — OXYTOCIN BOLUS FROM INFUSION
333.0000 mL | Freq: Once | INTRAVENOUS | Status: AC
  Administered 2022-08-07: 333 mL via INTRAVENOUS

## 2022-08-07 MED ORDER — LACTATED RINGERS IV SOLN
INTRAVENOUS | Status: DC
  Administered 2022-08-07: 125 mL/h via INTRAVENOUS

## 2022-08-07 MED ORDER — OXYCODONE-ACETAMINOPHEN 5-325 MG PO TABS
1.0000 | ORAL_TABLET | ORAL | Status: DC | PRN
  Administered 2022-08-07: 1 via ORAL
  Filled 2022-08-07: qty 1

## 2022-08-07 MED ORDER — PRENATAL MULTIVITAMIN CH
1.0000 | ORAL_TABLET | Freq: Every day | ORAL | Status: DC
  Administered 2022-08-07: 1 via ORAL
  Filled 2022-08-07 (×3): qty 1

## 2022-08-07 MED ORDER — ZOLPIDEM TARTRATE 5 MG PO TABS: 5.0000 mg | ORAL_TABLET | Freq: Every evening | ORAL | Status: DC | PRN

## 2022-08-07 MED ORDER — LACTATED RINGERS IV SOLN: 500.0000 mL | INTRAVENOUS | Status: DC | PRN

## 2022-08-07 MED ORDER — SOD CITRATE-CITRIC ACID 500-334 MG/5ML PO SOLN: 30.0000 mL | ORAL | Status: DC | PRN

## 2022-08-07 MED ORDER — ACETAMINOPHEN 325 MG PO TABS
650.0000 mg | ORAL_TABLET | ORAL | Status: DC | PRN
  Administered 2022-08-07: 650 mg via ORAL
  Filled 2022-08-07: qty 2

## 2022-08-07 MED ORDER — LIDOCAINE HCL (PF) 1 % IJ SOLN
30.0000 mL | INTRAMUSCULAR | Status: AC | PRN
  Administered 2022-08-07: 30 mL via SUBCUTANEOUS
  Filled 2022-08-07: qty 30

## 2022-08-07 MED ORDER — OXYTOCIN-SODIUM CHLORIDE 30-0.9 UT/500ML-% IV SOLN: 1.0000 m[IU]/min | INTRAVENOUS | Status: DC

## 2022-08-07 MED ORDER — DIPHENHYDRAMINE HCL 25 MG PO CAPS: 25.0000 mg | ORAL_CAPSULE | Freq: Four times a day (QID) | ORAL | Status: DC | PRN

## 2022-08-07 MED ORDER — SODIUM CHLORIDE 0.9 % IV SOLN: 250.0000 mL | INTRAVENOUS | Status: DC | PRN

## 2022-08-07 MED ORDER — SENNOSIDES-DOCUSATE SODIUM 8.6-50 MG PO TABS
2.0000 | ORAL_TABLET | ORAL | Status: DC
  Administered 2022-08-08: 2 via ORAL
  Filled 2022-08-07: qty 2

## 2022-08-07 MED ORDER — ONDANSETRON HCL 4 MG/2ML IJ SOLN: 4.0000 mg | INTRAMUSCULAR | Status: DC | PRN

## 2022-08-07 MED ORDER — OXYCODONE-ACETAMINOPHEN 5-325 MG PO TABS: 2.0000 | ORAL_TABLET | ORAL | Status: DC | PRN

## 2022-08-07 MED ORDER — OXYCODONE HCL 5 MG PO TABS
5.0000 mg | ORAL_TABLET | ORAL | Status: DC | PRN
  Administered 2022-08-07: 5 mg via ORAL
  Filled 2022-08-07: qty 1

## 2022-08-07 NOTE — Discharge Summary (Shared)
Postpartum Discharge Summary  Entire encounter performed in presence Santiago Glad of interpreter: Gershon Mussel #[id number]    Patient Name: Kathleen Henson DOB: Oct 29, 1995 MRN: [id number]  Date of admission: 08/07/2022 Delivery date:08/07/2022  Delivering provider: Concepcion Living  Date of discharge: 08/08/2022  Admitting diagnosis: Encounter for induction of labor [Z34.90] Intrauterine pregnancy: [redacted]w[redacted]d     Secondary diagnosis:  Principal Problem:   Vaginal delivery Active Problems:   Language barrier affecting health care   GDM (gestational diabetes mellitus), class A1   Supervision of high risk pregnancy, antepartum   Late prenatal care affecting pregnancy, antepartum   Encounter for induction of labor  Additional problems: n/a    Discharge diagnosis: Term Pregnancy Delivered                                              Post partum procedures: n/a Augmentation: AROM Complications: None  Hospital course: Onset of Labor With Vaginal Delivery      27 y.o. yo [phone] at [redacted]w[redacted]d was admitted in Active Labor on 08/07/2022. Labor course was complicated by none.  Membrane Rupture Time/Date: 6:20 AM ,08/07/2022   Delivery Method:Vaginal, Spontaneous  Episiotomy: None  Lacerations:  1st degree;Perineal  Patient had a postpartum course that was uncomplicated.  She is ambulating, tolerating a regular diet, passing flatus, and urinating well. Patient is discharged home in stable condition on 08/08/22.  Newborn Data: Birth date:08/07/2022  Birth time:6:54 AM  Gender:Female  Living status:Living  Apgars:9 ,9  Weight:3550 g   Magnesium Sulfate received: No BMZ received: No Rhophylac:N/A MMR:N/A T-DaP:Given prenatally Flu: Yes Transfusion:No  Physical exam  Vitals:   08/07/22 0945 08/07/22 1445 08/07/22 2138 08/08/22 0500  BP: (!) 112/58 112/61 115/63 106/60  Pulse: 74 92 90 85  Resp: 16 18 18 18   Temp: 98.1 F (36.7 C) 98 F (36.7 C) 98.3 F (36.8 C) 98.5 F (36.9 C)  TempSrc:  Oral Oral Oral  SpO2:  99% 98% 99% 98%   General: alert, cooperative, and no distress Lochia: appropriate Uterine Fundus: firm Incision: Healing well with no significant drainage DVT Evaluation: No evidence of DVT seen on physical exam. Labs: Lab Results  Component Value Date   WBC 13.5 (H) 08/08/2022   HGB 10.7 (L) 08/08/2022   HCT 32.1 (L) 08/08/2022   MCV 80.5 08/08/2022   PLT 312 08/08/2022      Latest Ref Rng & Units 02/01/2020    3:45 PM  CMP  Glucose 70 - 99 mg/dL 89   BUN 6 - 20 mg/dL 5   Creatinine 0.44 - 1.00 mg/dL 0.48   Sodium 135 - 145 mmol/L 136   Potassium 3.5 - 5.1 mmol/L 4.0   Chloride 98 - 111 mmol/L 104   CO2 22 - 32 mmol/L 23   Calcium 8.9 - 10.3 mg/dL 8.8   Total Protein 6.5 - 8.1 g/dL 6.6   Total Bilirubin 0.3 - 1.2 mg/dL 0.6   Alkaline Phos 38 - 126 U/L 52   AST 15 - 41 U/L 16   ALT 0 - 44 U/L 17    Edinburgh Score:    08/09/2020   11:02 AM  Edinburgh Postnatal Depression Scale Screening Tool  I have been able to laugh and see the funny side of things. 0  I have looked forward with enjoyment to things. 0  I have blamed myself unnecessarily when things  went wrong. 0  I have been anxious or worried for no good reason. 0  I have felt scared or panicky for no good reason. 0  Things have been getting on top of me. 0  I have been so unhappy that I have had difficulty sleeping. 0  I have felt sad or miserable. 0  I have been so unhappy that I have been crying. 0  The thought of harming myself has occurred to me. 0  Edinburgh Postnatal Depression Scale Total 0     After visit meds:  Allergies as of 08/08/2022   No Known Allergies      Medication List     STOP taking these medications    Accu-Chek Guide test strip Generic drug: glucose blood   Accu-Chek Softclix Lancets lancets   aspirin 81 MG chewable tablet   blood glucose meter kit and supplies Kit       TAKE these medications    acetaminophen 325 MG tablet Commonly known as: Tylenol Take 2 tablets  (650 mg total) by mouth every 4 (four) hours as needed (for pain scale < 4).   ibuprofen 600 MG tablet Commonly known as: ADVIL Take 1 tablet (600 mg total) by mouth every 6 (six) hours.   prenatal multivitamin Tabs tablet Take 1 tablet by mouth daily at 12 noon.         Discharge home in stable condition Infant Feeding: Bottle and Breast Infant Disposition:home with mother Discharge instruction: per After Visit Summary and Postpartum booklet. Activity: Advance as tolerated. Pelvic rest for 6 weeks.  Diet: routine diet Future Appointments: Future Appointments  Date Time Provider [place]  09/21/2022  9:35 AM Truett Mainland, DO [facility] None   Follow up Visit: Message sent to [facility] 2/5  Please schedule this patient for a In person postpartum visit in 6 weeks with the following provider: Any provider. Additional Postpartum F/U:2 hour GTT  High risk pregnancy complicated by: GDM Delivery mode:  Vaginal, Spontaneous  Anticipated Birth Control:  Nexplanon   08/08/2022 Simone Autry-Lott, DO

## 2022-08-07 NOTE — H&P (Addendum)
OBSTETRIC ADMISSION HISTORY AND PHYSICAL  Kathleen Henson is a 27 y.o. female [phone] with IUP at [redacted]w[redacted]d by LMP presenting for SOL. She reports +FMs, No LOF, no VB, no blurry vision, headaches or peripheral edema, and RUQ pain.  She plans on breast feeding. She request Nexplanon for birth control. She [facility]   Dating: By LMP --->  Estimated Date of Delivery: 08/18/22  Sono: @[redacted]w[redacted]d , CWD, normal anatomy, cephalic presentation, 5732K, 42% EFW   Prenatal History/Complications:  GDMA1 - fairly controlled H/o preterm labor @ 68 weeks          Nursing Staff Provider  Office Location [facility]  Dating   LMP  Language  Santiago Glad Anatomy US   Normal   Flu Vaccine  06/22/22 Genetic Screen  NIPS:  Declined AFP: Declined    TDaP vaccine  06/22/22 Hgb A1C or  GTT Abn Glucola at 9 wks last preg, A1C 5.3 this preg Glucola GDM  Rhogam   n/a   LAB RESULTS       Blood Type   O+    Feeding Plan  Breast Antibody  Neg  Contraception   Rubella  Immune  Circumcision   RPR   NR  Pediatrician   Health Dept [place] HBsAg   Neg  Support Person [name]  HCVAb   Neg  Prenatal Classes   HIV  NR  BTL Consent   GBS  neg  VBAC Consent  N/a Pap  Normal       Hgb Electro   Normal AA  BP Cuff   CF        SMA        Waterbirth  [ ]  Class [ ]  Consent [ ]  CNM visit      Induction  [ ]  Orders Entered [ ] Foley Y/N        Past Medical History: Past Medical History:  Diagnosis Date   Diabetes mellitus without complication ([place])    Type 2    Past Surgical History: Past Surgical History:  Procedure Laterality Date   NO PAST SURGERIES      Obstetrical History: OB History     Gravida  5   Para  3   Term  2   Preterm  1   AB  1   Living  3      SAB  1   IAB      Ectopic      Multiple  0   Live Births  3           Social History Social History   Socioeconomic History   Marital status: Married    Spouse name: Lobbyist   Number of children: Not on file    Years of education: Not on file   Highest education level: Not on file  Occupational History   Not on file  Tobacco Use   Smoking status: Never   Smokeless tobacco: Never  Vaping Use   Vaping Use: Never used  Substance and Sexual Activity   Alcohol use: No   Drug use: No   Sexual activity: Yes  Other Topics Concern   Not on file  Social History Narrative   Not on file   Social Determinants of Health   Financial Resource Strain: Not on file  Food Insecurity: Not on file  Transportation Needs: Not on file  Physical Activity: Not on file  Stress: Not on file  Social Connections: Not on file  Family History: Family History  Problem Relation Age of Onset   Cancer Neg Hx    Hypertension Neg Hx    Diabetes Neg Hx     Allergies: No Known Allergies  Medications Prior to Admission  Medication Sig Dispense Refill Last Dose   Accu-Chek Softclix Lancets lancets 1 each by Other route 4 (four) times daily. Use as instructed 100 each 12    aspirin 81 MG chewable tablet Chew 1 tablet (81 mg total) by mouth daily. (Patient not taking: Reported on 04/03/2022) 30 tablet 4    blood glucose meter kit and supplies KIT Dispense based on patient and insurance preference. Use up to four times daily as directed. 1 each 0    glucose blood (ACCU-CHEK GUIDE) test strip Use as instructed 100 each 12    Prenatal Vit-Fe Fumarate-FA (PRENATAL MULTIVITAMIN) TABS tablet Take 1 tablet by mouth daily at 12 noon. (Patient not taking: Reported on 03/28/2022) 30 tablet 10      Review of Systems   All systems reviewed and negative except as stated in HPI  Last menstrual period 11/11/2021, currently breastfeeding. General appearance: alert, cooperative, and appears stated age Lungs: clear to auscultation bilaterally Heart: regular rate and rhythm Abdomen: soft, non-tender; bowel sounds normal Extremities: Homans sign is negative, no sign of DVT Presentation: cephalic Fetal monitoringBaseline: 13-  bpm, Variability: Good {> 6 bpm), Accelerations: Reactive, and Decelerations: Absent Uterine activity: every 3-4 minutes Dilation: 6.5 Effacement (%): 80 Exam by:: Ardelle Lesches RN   Prenatal labs: ABO, Rh: O/Positive/-- (09/26 0102) Antibody: Negative (09/26 0952) Rubella: 2.09 (09/26 0952) RPR: Non Reactive (11/21 0830)  HBsAg: Negative (09/26 7253)  HIV: Non Reactive (11/21 0830)  GBS: Negative/-- (01/25 1442)  1 hr Glucola: abnormal Genetic screening: AFP normal Anatomy US: normal  Prenatal Transfer Tool  Maternal Diabetes: Yes:  Diabetes Type:  Diet controlled Genetic Screening: Normal Maternal Ultrasounds/Referrals: Normal Fetal Ultrasounds or other Referrals:  Referred to Materal Fetal Medicine  Maternal Substance Abuse:  No Significant Maternal Medications:  None Significant Maternal Lab Results: Group B Strep negative  No results found for this or any previous visit (from the past 24 hour(s)).  Patient Active Problem List   Diagnosis Date Noted   Late prenatal care affecting pregnancy, antepartum 04/28/2022   Supervision of high risk pregnancy, antepartum 03/28/2022   GDM (gestational diabetes mellitus), class A1 11/19/2019   History of preterm delivery 11/18/2019   Language barrier affecting health care 08/03/2017    Assessment/Plan:  Kathleen Henson is a 27 y.o. [id number] at [redacted]w[redacted]d here for SOL  #Labor: Active labor. AROM with clear fluid. Expectant management. #Pain: Epidural upon request #FWB: Cat 1 #ID:  GBS neg #MOF: Breast #MOC: Nexplanon #Circ:  N/A  Colletta Maryland, MD  08/07/2022, 5:50 AM ___ GME ATTESTATION:  Evaluation and management procedures were performed by the [facility] Resident under my supervision. I was immediately available for direct supervision, assistance and direction throughout this encounter.  I also confirm that I have verified the information documented in the resident's note, and that I have also personally reperformed the  pertinent components of the physical exam and all of the medical decision making activities.  I have also made any necessary editorial changes.  Shelda Pal, DO OB Fellow, [place] for [place] 08/07/2022 7:12 AM

## 2022-08-07 NOTE — MAU Note (Signed)
..  Kathleen Henson is a 27 y.o. at [redacted]w[redacted]d here in MAU reporting: contractions and urge to push. Denies leaking of fluid. +FM.

## 2022-08-07 NOTE — Lactation Note (Signed)
This note was copied from a baby's chart. Lactation Consultation Note  Patient Name: Kathleen Henson Today's Date: 08/07/2022 Reason for consult: Initial assessment;Early term 37-38.6wks Age:25 hours   LC Note:  Per RN, mother declined lactation services.   Maternal Data    Feeding Mother's Current Feeding Choice: Breast Milk and Formula Nipple Type: Extra Slow Flow  LATCH Score                    Lactation Tools Discussed/Used    Interventions    Discharge    Consult Status Consult Status: Complete (mother declined follow up)    Marrietta Thunder R Raffaella Edison 08/07/2022, 10:00 AM

## 2022-08-08 DIAGNOSIS — Z30017 Encounter for initial prescription of implantable subdermal contraceptive: Secondary | ICD-10-CM

## 2022-08-08 LAB — CBC
HCT: 32.1 % — ABNORMAL LOW (ref 36.0–46.0)
Hemoglobin: 10.7 g/dL — ABNORMAL LOW (ref 12.0–15.0)
MCH: 26.8 pg (ref 26.0–34.0)
MCHC: 33.3 g/dL (ref 30.0–36.0)
MCV: 80.5 fL (ref 80.0–100.0)
Platelets: 312 10*3/uL (ref 150–400)
RBC: 3.99 MIL/uL (ref 3.87–5.11)
RDW: 13.4 % (ref 11.5–15.5)
WBC: 13.5 10*3/uL — ABNORMAL HIGH (ref 4.0–10.5)
nRBC: 0 % (ref 0.0–0.2)

## 2022-08-08 MED ORDER — ETONOGESTREL 68 MG ~~LOC~~ IMPL
68.0000 mg | DRUG_IMPLANT | Freq: Once | SUBCUTANEOUS | Status: AC
  Administered 2022-08-08: 68 mg via SUBCUTANEOUS
  Filled 2022-08-08: qty 1

## 2022-08-08 MED ORDER — ACETAMINOPHEN 325 MG PO TABS: 650.0000 mg | ORAL_TABLET | ORAL | 0 refills | Status: DC | PRN

## 2022-08-08 MED ORDER — LIDOCAINE HCL 1 % IJ SOLN
0.0000 mL | Freq: Once | INTRAMUSCULAR | Status: DC | PRN
  Filled 2022-08-08: qty 20

## 2022-08-08 MED ORDER — IBUPROFEN 600 MG PO TABS: 600.0000 mg | ORAL_TABLET | Freq: Four times a day (QID) | ORAL | 0 refills | Status: DC

## 2022-08-08 NOTE — Procedures (Signed)
Post-Placental Nexplanon Insertion Procedure Note  Encounter done with help of [employer] Santiago Glad interpreter   Kathleen Henson is a 27 y.o. [name] s/p recent delivery who desires Nexplanon insertion. Patient identified, informed consent performed, consent signed, signed copy in chart.   Patient does understand that irregular bleeding is a very common side effect of this medication. Discussed other possible side effects and efficacy of method.   Appropriate time out taken.   Patient's left arm was prepped and draped in the usual sterile fashion. The insertion site was identified 8-10 cm (3-4 inches) from the medial epicondyle of the humerus and 3-5 cm (1.25-2 inches) posterior to (below) the sulcus (groove) between the biceps and triceps muscles of the patient's left arm and marked. The site was prepped and draped in the usual sterile fashion. Pt was prepped with alcohol swab and then injected with 3 ml of 1% lidocaine. The site was prepped with betadine. Nexplanon removed form packaging,  Device confirmed in needle, then inserted full length of needle and withdrawn per handbook instructions. Provider and patient verified presence of the implant in the woman's arm by palpation. Pt insertion site was covered with steristrips/adhesive bandage and pressure bandage. There was minimal blood loss. Patient tolerated procedure well.  Patient was given post procedure instructions and Nexplanon user card with expiration date. Condoms were recommended for STI prevention. Patient was asked to keep the pressure dressing on for 24 hours to minimize bruising and keep the adhesive bandage on for 3-5 days. The patient verbalized understanding of the plan of care and agrees.   Lot # [id number] Expiration Date 05/2024   Verita Schneiders, MD, [place], [facility] for [employer], [place]

## 2022-08-09 ENCOUNTER — Encounter: Payer: Medicaid Other | Admitting: Family Medicine

## 2022-08-09 LAB — BIRTH TISSUE RECOVERY COLLECTION (PLACENTA DONATION)

## 2022-08-12 ENCOUNTER — Inpatient Hospital Stay (HOSPITAL_COMMUNITY): Admission: RE | Admit: 2022-08-12 | Payer: Medicaid Other | Source: Home / Self Care | Admitting: Family Medicine

## 2022-08-12 ENCOUNTER — Inpatient Hospital Stay (HOSPITAL_COMMUNITY): Payer: Medicaid Other

## 2022-08-16 ENCOUNTER — Encounter: Payer: Medicaid Other | Admitting: Obstetrics & Gynecology

## 2022-08-18 ENCOUNTER — Inpatient Hospital Stay (HOSPITAL_COMMUNITY): Payer: Medicaid Other

## 2022-08-19 ENCOUNTER — Telehealth (HOSPITAL_COMMUNITY): Payer: Self-pay

## 2022-08-19 NOTE — Telephone Encounter (Signed)
Patient did not answer phone call. Voicemail left for patient.   Sharyn Lull [facility] 08/19/22,1128

## 2022-09-21 ENCOUNTER — Ambulatory Visit (INDEPENDENT_AMBULATORY_CARE_PROVIDER_SITE_OTHER): Payer: Medicaid Other | Admitting: Family Medicine

## 2022-09-21 ENCOUNTER — Encounter: Payer: Self-pay | Admitting: Family Medicine

## 2022-09-21 DIAGNOSIS — O24410 Gestational diabetes mellitus in pregnancy, diet controlled: Secondary | ICD-10-CM

## 2022-09-21 DIAGNOSIS — O24430 Gestational diabetes mellitus in the puerperium, diet controlled: Secondary | ICD-10-CM | POA: Diagnosis not present

## 2022-09-21 DIAGNOSIS — Z789 Other specified health status: Secondary | ICD-10-CM

## 2022-09-21 NOTE — Progress Notes (Signed)
[place] Partum Visit Note  Kathleen Henson is a 27 y.o. [id number] female who presents for a postpartum visit. She is 6 weeks postpartum following a normal spontaneous vaginal delivery.  I have fully reviewed the prenatal and intrapartum course. The delivery was at 27 gestational weeks.  Anesthesia: local. Postpartum course has been normal. Baby is doing well. Baby is feeding by breast. Bleeding no bleeding. Bowel function is normal. Bladder function is normal. Patient is not sexually active. Contraception method is Nexplanon. Postpartum depression screening: negative.   The pregnancy intention screening data noted above was reviewed. Potential methods of contraception were discussed. The patient elected to proceed with No data recorded.   Edinburgh Postnatal Depression Scale - 09/21/22 1001       Edinburgh Postnatal Depression Scale:  In the Past 7 Days   I have been able to laugh and see the funny side of things. 0    I have looked forward with enjoyment to things. 0    I have blamed myself unnecessarily when things went wrong. 0    I have been anxious or worried for no good reason. 0    I have felt scared or panicky for no good reason. 0    Things have been getting on top of me. 0    I have been so unhappy that I have had difficulty sleeping. 0    I have felt sad or miserable. 0    I have been so unhappy that I have been crying. 0    The thought of harming myself has occurred to me. 0    Edinburgh Postnatal Depression Scale Total 0             Health Maintenance Due  Topic Date Due   COVID-19 Vaccine (1) Never done   FOOT EXAM  Never done   OPHTHALMOLOGY EXAM  Never done   HPV VACCINES (1 - 2-dose series) Never done   Diabetic kidney evaluation - Urine ACR  11/17/2020   Diabetic kidney evaluation - eGFR measurement  01/31/2021    The following portions of the patient's history were reviewed and updated as appropriate: allergies, current medications, past family history, past  medical history, past social history, past surgical history, and problem list.  Review of Systems Pertinent items are noted in HPI.  Objective:  BP 114/71   Pulse 65   Wt 126 lb (57.2 kg)   LMP 11/11/2021   Breastfeeding Yes   BMI 26.33 kg/m    General:  alert, cooperative, and no distress   Breasts:  not indicated  Lungs: clear to auscultation bilaterally  Heart:  regular rate and rhythm, S1, S2 normal, no murmur, click, rub or gallop  Abdomen: soft, non-tender; bowel sounds normal; no masses,  no organomegaly   GU exam:  not indicated       Assessment:   1. Postpartum exam  2. Language barrier affecting health care Interpreter used  3. GDM (gestational diabetes mellitus), class A1 Check 2hr GTT.   Plan:   Essential components of care per ACOG recommendations:  1.  Mood and well being: Patient with negative depression screening today. Reviewed local resources for support.  - Patient tobacco use? No.   - hx of drug use? No.    2. Infant care and feeding:  -Patient currently breastmilk feeding? Yes. Reviewed importance of draining breast regularly to support lactation.  -Social determinants of health (SDOH) reviewed in EPIC. No concerns  3. Sexuality, contraception and birth  spacing - Patient does not want a pregnancy in the next year.  - Reviewed reproductive life planning. Reviewed contraceptive methods based on pt preferences and effectiveness.  Patient desired Hormonal Implant today.   - Discussed birth spacing of 18 months  4. Sleep and fatigue -Encouraged family/partner/community support of 4 hrs of uninterrupted sleep to help with mood and fatigue  5. Physical Recovery  - Discussed patients delivery and complications. She describes her labor as good. - Patient had a Vaginal, no problems at delivery. Patient had a 1st degree laceration. Perineal healing reviewed. Patient expressed understanding - Patient has urinary incontinence? No. - Patient is safe to  resume physical and sexual activity  6.  Health Maintenance - HM due items addressed Yes - Last pap smear  Diagnosis  Date Value Ref Range Status  03/28/2022   Final   - Negative for intraepithelial lesion or malignancy (NILM)   Pap smear not done at today's visit.  -Breast Cancer screening indicated? No.   7. Chronic Disease/Pregnancy Condition follow up: Gestational Diabetes  - PCP follow up  [place] for [place]

## 2022-10-13 ENCOUNTER — Other Ambulatory Visit: Payer: Medicaid Other
